# Patient Record
Sex: Male | Born: 2000 | Race: Black or African American | Hispanic: No | Marital: Single | Smoking: Never smoker
Health system: Southern US, Community
[De-identification: ages and names within clinical notes are randomized; demographics above are authoritative.]

## PROBLEM LIST (undated history)

## (undated) DIAGNOSIS — F70 Mild intellectual disabilities: Secondary | ICD-10-CM

## (undated) DIAGNOSIS — F319 Bipolar disorder, unspecified: Secondary | ICD-10-CM

## (undated) DIAGNOSIS — F913 Oppositional defiant disorder: Secondary | ICD-10-CM

## (undated) DIAGNOSIS — F909 Attention-deficit hyperactivity disorder, unspecified type: Secondary | ICD-10-CM

---
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
## 2020-05-30 ENCOUNTER — Other Ambulatory Visit: Payer: Self-pay

## 2020-05-30 ENCOUNTER — Ambulatory Visit: Payer: Medicaid Other | Attending: Internal Medicine

## 2020-05-30 DIAGNOSIS — Z23 Encounter for immunization: Secondary | ICD-10-CM

## 2020-05-30 NOTE — Progress Notes (Signed)
° °  Covid-19 Vaccination Clinic  Name:  Edward [name]    MRN: [id number] DOB: September 11, 2000  05/30/2020  Edward [name] was observed post Covid-19 immunization for 15 minutes without incident. He was provided with Vaccine Information Sheet and instruction to access the V-Safe system.   Edward [name] was instructed to call 911 with any severe reactions post vaccine:  Difficulty breathing   Swelling of face and throat   A fast heartbeat   A bad rash all over body   Dizziness and weakness   Immunizations Administered    Name Date Dose VIS Date Route   Pfizer COVID-19 Vaccine 05/30/2020  1:23 PM 0.3 mL 10/27/2018 Intramuscular   Manufacturer: [employer]   Lot: [id number]   NDC: [id number]

## 2020-11-29 ENCOUNTER — Emergency Department (HOSPITAL_COMMUNITY)
Admission: EM | Admit: 2020-11-29 | Discharge: 2020-11-29 | Disposition: A | Discharge status: Home / Self Care | Payer: Medicaid Other | Attending: Emergency Medicine | Admitting: Emergency Medicine

## 2020-11-29 DIAGNOSIS — Z5321 Procedure and treatment not carried out due to patient leaving prior to being seen by health care provider: Secondary | ICD-10-CM | POA: Diagnosis not present

## 2020-11-29 DIAGNOSIS — Y92410 Unspecified street and highway as the place of occurrence of the external cause: Secondary | ICD-10-CM | POA: Insufficient documentation

## 2020-11-29 DIAGNOSIS — S8991XA Unspecified injury of right lower leg, initial encounter: Secondary | ICD-10-CM | POA: Diagnosis present

## 2020-11-29 DIAGNOSIS — S81011A Laceration without foreign body, right knee, initial encounter: Secondary | ICD-10-CM | POA: Insufficient documentation

## 2020-11-29 DIAGNOSIS — Y9355 Activity, bike riding: Secondary | ICD-10-CM | POA: Diagnosis not present

## 2020-11-29 LAB — CBC WITH DIFFERENTIAL/PLATELET
Abs Immature Granulocytes: 0 10*3/uL (ref 0.00–0.07)
Basophils Absolute: 0 10*3/uL (ref 0.0–0.1)
Basophils Relative: 1 %
Eosinophils Absolute: 0.3 10*3/uL (ref 0.0–0.5)
Eosinophils Relative: 6 %
HCT: 41 % (ref 39.0–52.0)
Hemoglobin: 13.9 g/dL (ref 13.0–17.0)
Immature Granulocytes: 0 %
Lymphocytes Relative: 34 %
Lymphs Abs: 1.6 10*3/uL (ref 0.7–4.0)
MCH: 29 pg (ref 26.0–34.0)
MCHC: 33.9 g/dL (ref 30.0–36.0)
MCV: 85.6 fL (ref 80.0–100.0)
Monocytes Absolute: 0.7 10*3/uL (ref 0.1–1.0)
Monocytes Relative: 15 %
Neutro Abs: 2.1 10*3/uL (ref 1.7–7.7)
Neutrophils Relative %: 44 %
Platelets: 233 10*3/uL (ref 150–400)
RBC: 4.79 MIL/uL (ref 4.22–5.81)
RDW: 14.9 % (ref 11.5–15.5)
WBC: 4.7 10*3/uL (ref 4.0–10.5)
nRBC: 0 % (ref 0.0–0.2)

## 2020-11-29 LAB — COMPREHENSIVE METABOLIC PANEL
ALT: 15 U/L (ref 0–44)
AST: 26 U/L (ref 15–41)
Albumin: 3.9 g/dL (ref 3.5–5.0)
Alkaline Phosphatase: 42 U/L (ref 38–126)
Anion gap: 3 — ABNORMAL LOW (ref 5–15)
BUN: 9 mg/dL (ref 6–20)
CO2: 30 mmol/L (ref 22–32)
Calcium: 9.2 mg/dL (ref 8.9–10.3)
Chloride: 104 mmol/L (ref 98–111)
Creatinine, Ser: 0.92 mg/dL (ref 0.61–1.24)
GFR, Estimated: >60 mL/min (ref >60)
Glucose, Bld: 100 mg/dL — ABNORMAL HIGH (ref 70–99)
Potassium: 3.8 mmol/L (ref 3.5–5.1)
Sodium: 137 mmol/L (ref 135–145)
Total Bilirubin: 0.9 mg/dL (ref 0.3–1.2)
Total Protein: 7.5 g/dL (ref 6.5–8.1)

## 2020-11-29 NOTE — ED Notes (Signed)
Pt stated "I am going home", LWBS, moving OTF.
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
## 2022-07-11 ENCOUNTER — Ambulatory Visit (HOSPITAL_COMMUNITY)
Admission: EM | Admit: 2022-07-11 | Discharge: 2022-07-11 | Disposition: A | Discharge status: Home / Self Care | Payer: Medicaid Other | Attending: Psychiatry | Admitting: Psychiatry

## 2022-07-11 DIAGNOSIS — Z1152 Encounter for screening for COVID-19: Secondary | ICD-10-CM | POA: Insufficient documentation

## 2022-07-11 DIAGNOSIS — Z5901 Sheltered homelessness: Secondary | ICD-10-CM | POA: Insufficient documentation

## 2022-07-11 DIAGNOSIS — R45851 Suicidal ideations: Secondary | ICD-10-CM | POA: Insufficient documentation

## 2022-07-11 DIAGNOSIS — R4689 Other symptoms and signs involving appearance and behavior: Secondary | ICD-10-CM

## 2022-07-11 DIAGNOSIS — F418 Other specified anxiety disorders: Secondary | ICD-10-CM

## 2022-07-11 DIAGNOSIS — F319 Bipolar disorder, unspecified: Secondary | ICD-10-CM | POA: Insufficient documentation

## 2022-07-11 DIAGNOSIS — F3481 Disruptive mood dysregulation disorder: Secondary | ICD-10-CM | POA: Insufficient documentation

## 2022-07-11 DIAGNOSIS — Z79899 Other long term (current) drug therapy: Secondary | ICD-10-CM | POA: Insufficient documentation

## 2022-07-11 DIAGNOSIS — F69 Unspecified disorder of adult personality and behavior: Secondary | ICD-10-CM

## 2022-07-11 DIAGNOSIS — R45850 Homicidal ideations: Secondary | ICD-10-CM | POA: Insufficient documentation

## 2022-07-11 LAB — COMPREHENSIVE METABOLIC PANEL
ALT: 15 U/L (ref 0–44)
AST: 26 U/L (ref 15–41)
Albumin: 4.3 g/dL (ref 3.5–5.0)
Alkaline Phosphatase: 43 U/L (ref 38–126)
Anion gap: 12 (ref 5–15)
BUN: 9 mg/dL (ref 6–20)
CO2: 26 mmol/L (ref 22–32)
Calcium: 9.9 mg/dL (ref 8.9–10.3)
Chloride: 98 mmol/L (ref 98–111)
Creatinine, Ser: 0.86 mg/dL (ref 0.61–1.24)
GFR, Estimated: >60 mL/min (ref >60)
Glucose, Bld: 124 mg/dL — ABNORMAL HIGH (ref 70–99)
Potassium: 3.3 mmol/L — ABNORMAL LOW (ref 3.5–5.1)
Sodium: 136 mmol/L (ref 135–145)
Total Bilirubin: 2 mg/dL — ABNORMAL HIGH (ref 0.3–1.2)
Total Protein: 7.8 g/dL (ref 6.5–8.1)

## 2022-07-11 LAB — RESP PANEL BY RT-PCR (FLU A&B, COVID) ARPGX2
Influenza A by PCR: NEGATIVE
Influenza B by PCR: NEGATIVE
SARS Coronavirus 2 by RT PCR: NEGATIVE

## 2022-07-11 LAB — POCT URINE DRUG SCREEN - MANUAL ENTRY (I-SCREEN)
POC Amphetamine UR: NOT DETECTED
POC Buprenorphine (BUP): NOT DETECTED
POC Cocaine UR: NOT DETECTED
POC Marijuana UR: NOT DETECTED
POC Methadone UR: NOT DETECTED
POC Methamphetamine UR: NOT DETECTED
POC Morphine: NOT DETECTED
POC Oxazepam (BZO): NOT DETECTED
POC Oxycodone UR: NOT DETECTED
POC Secobarbital (BAR): NOT DETECTED

## 2022-07-11 LAB — CBC WITH DIFFERENTIAL/PLATELET
Abs Immature Granulocytes: 0.01 10*3/uL (ref 0.00–0.07)
Basophils Absolute: 0 10*3/uL (ref 0.0–0.1)
Basophils Relative: 1 %
Eosinophils Absolute: 0.2 10*3/uL (ref 0.0–0.5)
Eosinophils Relative: 3 %
HCT: 44.4 % (ref 39.0–52.0)
Hemoglobin: 15.5 g/dL (ref 13.0–17.0)
Immature Granulocytes: 0 %
Lymphocytes Relative: 38 %
Lymphs Abs: 2.4 10*3/uL (ref 0.7–4.0)
MCH: 28.8 pg (ref 26.0–34.0)
MCHC: 34.9 g/dL (ref 30.0–36.0)
MCV: 82.5 fL (ref 80.0–100.0)
Monocytes Absolute: 0.6 10*3/uL (ref 0.1–1.0)
Monocytes Relative: 10 %
Neutro Abs: 3 10*3/uL (ref 1.7–7.7)
Neutrophils Relative %: 48 %
Platelets: 228 10*3/uL (ref 150–400)
RBC: 5.38 MIL/uL (ref 4.22–5.81)
RDW: 13.9 % (ref 11.5–15.5)
WBC: 6.2 10*3/uL (ref 4.0–10.5)
nRBC: 0 % (ref 0.0–0.2)

## 2022-07-11 LAB — HEMOGLOBIN A1C
Hgb A1c MFr Bld: 5.3 % (ref 4.8–5.6)
Mean Plasma Glucose: 105.41 mg/dL

## 2022-07-11 LAB — LIPID PANEL
Cholesterol: 136 mg/dL (ref 0–200)
HDL: 51 mg/dL (ref >40)
LDL Cholesterol: 77 mg/dL (ref 0–99)
Total CHOL/HDL Ratio: 2.7 RATIO
Triglycerides: 39 mg/dL (ref <150)
VLDL: 8 mg/dL (ref 0–40)

## 2022-07-11 LAB — POC SARS CORONAVIRUS 2 AG: SARSCOV2ONAVIRUS 2 AG: NEGATIVE

## 2022-07-11 MED ORDER — MAGNESIUM HYDROXIDE 400 MG/5ML PO SUSP: 30.0000 mL | Freq: Every day | ORAL | Status: DC | PRN

## 2022-07-11 MED ORDER — ARIPIPRAZOLE 5 MG PO TABS: 5.0000 mg | ORAL_TABLET | Freq: Every day | ORAL | 0 refills | Status: DC

## 2022-07-11 MED ORDER — ACETAMINOPHEN 325 MG PO TABS: 650.0000 mg | ORAL_TABLET | Freq: Four times a day (QID) | ORAL | Status: DC | PRN

## 2022-07-11 MED ORDER — ARIPIPRAZOLE 5 MG PO TABS
5.0000 mg | ORAL_TABLET | Freq: Every day | ORAL | Status: DC
  Administered 2022-07-11: 5 mg via ORAL
  Filled 2022-07-11: qty 1

## 2022-07-11 MED ORDER — CARBAMAZEPINE 200 MG PO TABS
200.0000 mg | ORAL_TABLET | Freq: Two times a day (BID) | ORAL | Status: DC
  Administered 2022-07-11: 200 mg via ORAL
  Filled 2022-07-11: qty 1

## 2022-07-11 MED ORDER — ALUM & MAG HYDROXIDE-SIMETH 200-200-20 MG/5ML PO SUSP: 30.0000 mL | ORAL | Status: DC | PRN

## 2022-07-11 NOTE — ED Notes (Signed)
Phone DASH pickup called and notified of pending collected STAT Labs to be transported to [facility] 

## 2022-07-11 NOTE — ED Notes (Signed)
Discharge instructions provided and Pt stated understanding. Pt alert, orient and ambulatory prior to d/c from facility. Personal belongings returned from locker number 21. Pt allowed to change clothes in locker room. Pt escorted to lobby to D/C from facility. Safety maintained.

## 2022-07-11 NOTE — ED Notes (Signed)
Patient to unit - denies SI, HI AVH at this time - will continue to monitor for safety

## 2022-07-11 NOTE — Discharge Instructions (Addendum)
Discharge recommendations:  Patient is to take medications as prescribed. Abilify 5 mg po daily was added on 07/10/22. Take Abilify 5 mg by mouth daily x 14 days and follow up with your outpatient psychiatrist for medication management.   Please see information for follow-up appointment with psychiatry and therapy.  Please follow up with your primary care provider for all medical related needs.   Therapy: We recommend that patient participate in individual therapy to address mental health concerns.  Medications: The parent/guardian is to contact a medical professional and/or outpatient provider to address any new side effects that develop. Parent/guardian should update outpatient providers of any new medications and/or medication changes.   Atypical antipsychotics: If you are prescribed an atypical antipsychotic, it is recommended that your height, weight, BMI, blood pressure, fasting lipid panel, and fasting blood sugar be monitored by your outpatient providers.  Safety:  The patient should abstain from use of illicit substances/drugs and abuse of any medications. If symptoms worsen or do not continue to improve or if the patient becomes actively suicidal or homicidal then it is recommended that the patient return to the closest hospital emergency department, the [facility], or call 911 for further evaluation and treatment. National Suicide Prevention Lifeline 1-800-SUICIDE or 601-595-5688.  About 988 988 offers 24/7 access to trained crisis counselors who can help people experiencing mental health-related distress. People can call or text 988 or chat 988lifeline.org for themselves or if they are worried about a loved one who may need crisis support.

## 2022-07-11 NOTE — ED Provider Notes (Signed)
FBC/OBS ASAP Discharge Summary  Date and Time: 07/11/2022 9:56 AM  Name: Edward [name]  MRN:  [id number]   Discharge Diagnoses:  Final diagnoses:  Suicidal ideation  Aggression  Anxious depression  Behavior concern in adult    Subjective: Patient seen and reevaluated face-to-face by this provider, chart reviewed and case discussed with Dr. Lucianne Muss. On evaluation, patient is alert and oriented x 4. His thought process is linear and speech is clear and coherent at a moderate tone. His mood is euthymic and affect is congruent. He has fair eye contact. Today, he denies suicidal ideations. He verbally contracts for safety. He denies past suicide attempts. He denies homicidal ideations. He denies auditory or visual hallucinations. There is no objective evidence that the patient is currently responding to internal or external stimuli. He reports fair sleep. He reports a fair appetite. He states that last night he was upset because of a situation with his peers. He states that he has been getting bullied online, on social media by his peers. He states that his peers then accused him of harassment which upset him. He states that he receives outpatient therapy with [facility] and that his next appointment is on next Wednesday. He states that he is prescribed fluoxetine. He states that last night he was given 2 new medications. He denies medication side effects to those medications. He lives alone in transitional housing. He denies access to weapon, including guns. He identifies Ms. [name] and the staff at [facility] as his support system. He identifies healthy coping skills as playing his game which makes him feel neutral, and listening to rock music. We discussed additional healthy coping mechanisms such as journaling, taking a walk, deep breathing, and reaching out to his support system.  He verbalizes understanding and agrees to the stated plan.  I reached out to [employer] at [facility] this  morning who was not available. I spoke to Lezlie Lye at [facility] who states that [name] is unavailable. She states that she can send [name] to pick the patient up from the facility. She states that the patient is his own guardian and lives alone. I safety planned with Lezlie Lye. I advised that all weapons in the home including guns and sharp knives be removed and locked up for safety. I advised that if the patient's symptoms worsen or he verbalizes active suicidal thoughts to bring the patient to the [facility], nearest ED or call 911 for an evaluation. MS. LaBonne verbalizes understanding and denies any safety concerns with the patient discharging home today.  Stay Summary: Triage notes copied: Pt presents to [facility] voluntarily, accompanied by his transitional home program manager due to feeling paranoid and suicidal ideation with no plan. Pt was unable to give specific information pertaining to what triggered his stress. Per home manager, pt was involved in online bullying and pt sent a text message tonight stating that he needed help and was having suicidal thoughts. Pt stated that "I mean I think about it and want to die, but I'm too scared to do it". Pt has hx aggressive behaviors, DMDD, HI. Pt is prescribed Fluoxetine 20mg  and also receives outpatient therapy through [facility]. Pt reports that he has taken medication since yesterday. Pt currently denies SI, HI, AVH and substance/alcohol use.   Patient was started on Abilify 5 mg p.o. daily and Tegretol 200 mg twice daily on 07/10/22 by 13/8/23, NP. Tegretol was discontinued. Patient was advised to continue on Prozac 20  mg p.o. daily as prescribed by his outpatient provider.  And Abilify 5 mg p.o. daily for mood stabilization.    Patient was observed on the unit without any disruptive, aggressive, psychotic, or self-harm behaviors.  Total Time spent with patient: 30 minutes  Past Psychiatric History: History of  oppositional defiant disorder, disruptive mood dysregulation disorder, and aggressive behaviors.  Past Medical History:  Past Medical History:  Diagnosis Date   ADHD    Bipolar 1 disorder (HCC)    ODD (oppositional defiant disorder)    No past surgical history on file. Family History: No family history on file. Family Psychiatric History: No known history.  Social History:  Social History   Substance and Sexual Activity  Alcohol Use No     Social History   Substance and Sexual Activity  Drug Use No    Social History   Socioeconomic History   Marital status: Single    Spouse name: Not on file   Number of children: Not on file   Years of education: Not on file   Highest education level: Not on file  Occupational History   Not on file  Tobacco Use   Smoking status: Never   Smokeless tobacco: Never  Substance and Sexual Activity   Alcohol use: No   Drug use: No   Sexual activity: Not on file  Other Topics Concern   Not on file  Social History Narrative   Not on file   Social Determinants of Health   Financial Resource Strain: Not on file  Food Insecurity: Not on file  Transportation Needs: Not on file  Physical Activity: Not on file  Stress: Not on file  Social Connections: Not on file   SDOH:  SDOH Screenings   Tobacco Use: Low Risk  (09/15/2020)    Tobacco Cessation:  N/A, patient does not currently use tobacco products  Current Medications:  Current Facility-Administered Medications  Medication Dose Route Frequency Provider Last Rate Last Admin   acetaminophen (TYLENOL) tablet 650 mg  650 mg Oral Q6H PRN Sindy Guadeloupe, NP       alum & mag hydroxide-simeth (MAALOX/MYLANTA) 200-200-20 MG/5ML suspension 30 mL  30 mL Oral Q4H PRN Sindy Guadeloupe, NP       ARIPiprazole (ABILIFY) tablet 5 mg  5 mg Oral Daily Sindy Guadeloupe, NP   5 mg at 07/11/22 2130   magnesium hydroxide (MILK OF MAGNESIA) suspension 30 mL  30 mL Oral Daily PRN Sindy Guadeloupe, NP        Current Outpatient Medications  Medication Sig Dispense Refill   FLUoxetine (PROZAC) 20 MG capsule Take 20 mg by mouth every morning.     [START ON 07/12/2022] ARIPiprazole (ABILIFY) 5 MG tablet Take 1 tablet (5 mg total) by mouth daily. 14 tablet 0     Musculoskeletal  Strength & Muscle Tone: within normal limits Gait & Station: normal Patient leans: N/A  Psychiatric Specialty Exam  Presentation  General Appearance:  Appropriate for Environment  Eye Contact: Fair  Speech: Clear and Coherent  Speech Volume: Normal  Handedness: Right   Mood and Affect  Mood: Dysphoric  Affect: Congruent   Thought Process  Thought Processes: Coherent  Descriptions of Associations:Intact  Orientation:Full (Time, Place and Person)  Thought Content:Logical  Diagnosis of Schizophrenia or Schizoaffective disorder in past: No    Hallucinations:Hallucinations: None  Ideas of Reference:None  Suicidal Thoughts:Suicidal Thoughts: No SI Passive Intent and/or Plan: With Plan  Homicidal Thoughts:Homicidal Thoughts: No   Sensorium  Memory: Immediate  Fair; Recent Fair; Remote Fair  Judgment: Fair  Insight: Fair   Chartered certified accountant: Fair  Attention Span: Fair  Recall: [employer] of Knowledge: Fair  Language: Fair   Psychomotor Activity  Psychomotor Activity: Psychomotor Activity: Normal   Assets  Assets: Communication Skills; Desire for Improvement; Financial Resources/Insurance; Housing; Leisure Time; Physical Health; Social Support   Sleep  Sleep: Sleep: Fair Number of Hours of Sleep: 6   Nutritional Assessment (For OBS and FBC admissions only) Has the patient had a weight loss or gain of 10 pounds or more in the last 3 months?: No Has the patient had a decrease in food intake/or appetite?: No Does the patient have dental problems?: No Does the patient have eating habits or behaviors that may be indicators of an eating  disorder including binging or inducing vomiting?: No Has the patient recently lost weight without trying?: 0 Has the patient been eating poorly because of a decreased appetite?: 0 Malnutrition Screening Tool Score: 0    Physical Exam  Physical Exam HENT:     Head: Normocephalic.     Nose: Nose normal.  Eyes:     Conjunctiva/sclera: Conjunctivae normal.  Cardiovascular:     Rate and Rhythm: Normal rate.  Pulmonary:     Effort: Pulmonary effort is normal.  Musculoskeletal:        General: Normal range of motion.  Neurological:     Mental Status: He is alert and oriented to person, place, and time.    Review of Systems  Constitutional: Negative.   HENT: Negative.    Eyes: Negative.   Respiratory: Negative.    Cardiovascular: Negative.   Gastrointestinal: Negative.   Genitourinary: Negative.   Musculoskeletal: Negative.   Neurological: Negative.   Endo/Heme/Allergies: Negative.    Blood pressure 107/75, pulse (!) 56, temperature 98.1 F (36.7 C), temperature source Oral, resp. rate 18, SpO2 100 %. There is no height or weight on file to calculate BMI.  Demographic Factors:  Male, Adolescent or young adult, and Living alone  Loss Factors: NA  Historical Factors: Impulsivity  Risk Reduction Factors:   Religious beliefs about death and Positive social support  Continued Clinical Symptoms:  Previous Psychiatric Diagnoses and Treatments  Cognitive Features That Contribute To Risk:  None    Suicide Risk:  Minimal: No identifiable suicidal ideation.  Patients presenting with no risk factors but with morbid ruminations; may be classified as minimal risk based on the severity of the depressive symptoms  Plan Of Care/Follow-up recommendations:  Activity:  as tolerated.   Discharge recommendations:  Patient is to take medications as prescribed. Abilify 5 mg po daily was added on 07/10/22. Take Abilify 5 mg by mouth daily x 14 days and follow up with your outpatient  psychiatrist for medication management.   Please see information for follow-up appointment with psychiatry and therapy.  Please follow up with your primary care provider for all medical related needs.   Therapy: We recommend that patient participate in individual therapy to address mental health concerns.  Medications: The parent/guardian is to contact a medical professional and/or outpatient provider to address any new side effects that develop. Parent/guardian should update outpatient providers of any new medications and/or medication changes.   Atypical antipsychotics: If you are prescribed an atypical antipsychotic, it is recommended that your height, weight, BMI, blood pressure, fasting lipid panel, and fasting blood sugar be monitored by your outpatient providers.  Safety:  The patient should abstain from use of illicit substances/drugs  and abuse of any medications. If symptoms worsen or do not continue to improve or if the patient becomes actively suicidal or homicidal then it is recommended that the patient return to the closest hospital emergency department, the [facility], or call 911 for further evaluation and treatment. National Suicide Prevention Lifeline 1-800-SUICIDE or 205-875-4236.  About 988 988 offers 24/7 access to trained crisis counselors who can help people experiencing mental health-related distress. People can call or text 988 or chat 988lifeline.org for themselves or if they are worried about a loved one who may need crisis support.    Follow-up Information     [facility].   Specialty: Psychology Contact information: [address] [place] [address] [phone]         [facility].   Specialty: Professional Counselor Why: intensive in home if needed but also outpatient therapy Contact information: [address] [place]  [address] [phone]                 Disposition: Discharge home.   Layla Barter, NP 07/11/2022, 9:56 AM

## 2022-07-11 NOTE — ED Triage Notes (Signed)
Pt presents to [facility] voluntarily, accompanied by his transitional home program manager due to feeling paranoid and suicidal ideation with no plan. Pt was unable to give specific information pertaining to what triggered his stress. Per home manager, pt was involved in online bullying and pt sent a text message tonight stating that he needed help and was having suicidal thoughts. Pt stated that "I mean I think about it and want to die, but I'm too scared to do it". Pt has hx aggressive behaviors, DMDD, HI. Pt is prescribed Fluoxetine 20mg  and also receives outpatient therapy through [facility]. Pt reports that he has taken medication since yesterday. Pt currently denies SI, HI, AVH and substance/alcohol use.

## 2022-07-11 NOTE — ED Notes (Signed)
Patient resting at this time with no sxs of distress noted - will continue to monitor for safety  

## 2022-07-11 NOTE — ED Provider Notes (Signed)
[facility] Continuous Assessment Admission H&P  Date: 07/11/22 Patient Name: Edward [name] MRN: [id number] Chief Complaint:  Chief Complaint  Patient presents with   Suicidal      Diagnoses:  Final diagnoses:  Suicidal ideation  Aggression  Anxious depression  Behavior concern in adult    HPI: Edward [name] "Edward [name]",  21 y.o male, with a history of DMDD, aggressive behavior, homicidal ideation presented to [facility] accompanied by his transitional home Dietitian.  According to patient he is not feeling good he is emotionally stressed because he was involved in a online bullying because he had sent a text tonight stating he needs help and he was having suicidal thoughts.  Patient is not a good historian and patient is not forthcoming with his story.   Triage notes copied: Pt presents to [facility] voluntarily, accompanied by his transitional home program manager due to feeling paranoid and suicidal ideation with no plan. Pt was unable to give specific information pertaining to what triggered his stress. Per home manager, pt was involved in online bullying and pt sent a text message tonight stating that he needed help and was having suicidal thoughts. Pt stated that "I mean I think about it and want to die, but I'm too scared to do it". Pt has hx aggressive behaviors, DMDD, HI. Pt is prescribed Fluoxetine 20mg  and also receives outpatient therapy through [facility]. Pt reports that he has taken medication since yesterday. Pt currently denies SI, HI, AVH and substance/alcohol use.    Collateral, her transitional manager patient is not safe going home tonight because patient will be alone by himself however staff will be present tomorrow start around 10 AM.   Face-to-face observation of patient, patient is alert and oriented, speech is clear however patient refused to answer questions appropriately.  Patient keeps holding his head down and refused to make eye  contact.  Patient endorsed suicidal ideations but have no immediate plans denies HI, AVH, denies alcohol use denies drug use.   Recommend inpatient observation   PHQ 2-9:   Flowsheet Row ED from 07/11/2022 in [facility] ED from 11/29/2020 in [facility] EMERGENCY DEPARTMENT ED from 11/07/2019 in [facility] EMERGENCY DEPARTMENT  C-SSRS RISK CATEGORY High Risk No Risk Error: Q6 is Yes, you must answer 7        Total Time spent with patient: 20 minutes  Musculoskeletal  Strength & Muscle Tone: within normal limits Gait & Station: normal Patient leans: N/A  Psychiatric Specialty Exam  Presentation General Appearance:  Casual  Eye Contact: Minimal  Speech: Blocked  Speech Volume: Normal  Handedness: Right   Mood and Affect  Mood: Depressed  Affect: Constricted   Thought Process  Thought Processes: Linear  Descriptions of Associations:Intact  Orientation:Full (Time, Place and Person)  Thought Content:WDL    Hallucinations:Hallucinations: None  Ideas of Reference:None  Suicidal Thoughts:Suicidal Thoughts: Yes, Passive SI Passive Intent and/or Plan: With Plan  Homicidal Thoughts:Homicidal Thoughts: No   Sensorium  Memory: Immediate Fair  Judgment: Poor  Insight: Fair   [facility]: Fair  Attention Span: Fair  Recall: Chartered certified accountant of Knowledge: Fair  Language: Fair   Psychomotor Activity  Psychomotor Activity: Psychomotor Activity: Normal   Assets  Assets: Desire for Improvement; Resilience; Social Support   Sleep  Sleep: Sleep: Fair Number of Hours of Sleep: 6   Nutritional Assessment (For OBS and FBC admissions only) Has the patient had  a weight loss or gain of 10 pounds or more in the last 3 months?: No Has the patient had a decrease in food intake/or appetite?: No Does the patient have dental problems?: No Does the patient have  eating habits or behaviors that may be indicators of an eating disorder including binging or inducing vomiting?: No Has the patient recently lost weight without trying?: 0 Has the patient been eating poorly because of a decreased appetite?: 0 Malnutrition Screening Tool Score: 0    Physical Exam HENT:     Head: Normocephalic.     Nose: Nose normal.  Cardiovascular:     Rate and Rhythm: Normal rate.  Pulmonary:     Effort: Pulmonary effort is normal.  Musculoskeletal:        General: Normal range of motion.     Cervical back: Normal range of motion.  Skin:    General: Skin is warm.  Neurological:     General: No focal deficit present.     Mental Status: He is alert.  Psychiatric:        Mood and Affect: Mood normal.        Behavior: Behavior normal.        Thought Content: Thought content normal.        Judgment: Judgment normal.    Review of Systems  Constitutional: Negative.   HENT: Negative.    Eyes: Negative.   Respiratory: Negative.    Cardiovascular: Negative.   Gastrointestinal: Negative.   Genitourinary: Negative.   Musculoskeletal: Negative.   Skin: Negative.   Neurological: Negative.   Endo/Heme/Allergies: Negative.   Psychiatric/Behavioral:  Positive for depression and suicidal ideas. The patient is nervous/anxious.     Blood pressure 125/81, pulse (!) 59, temperature 98.9 F (37.2 C), temperature source Oral, resp. rate 16, SpO2 100 %. There is no height or weight on file to calculate BMI.  Past Psychiatric History: DMDD, aggression    Is the patient at risk to self? Yes  Has the patient been a risk to self in the past 6 months? Yes .    Has the patient been a risk to self within the distant past? Yes   Is the patient a risk to others? Yes   Has the patient been a risk to others in the past 6 months? No   Has the patient been a risk to others within the distant past? No   Past Medical History:  Past Medical History:  Diagnosis Date   ADHD     Bipolar 1 disorder (HCC)    ODD (oppositional defiant disorder)    No past surgical history on file.  Family History: No family history on file.  Social History:  Social History   Socioeconomic History   Marital status: Single    Spouse name: Not on file   Number of children: Not on file   Years of education: Not on file   Highest education level: Not on file  Occupational History   Not on file  Tobacco Use   Smoking status: Never   Smokeless tobacco: Never  Substance and Sexual Activity   Alcohol use: No   Drug use: No   Sexual activity: Not on file  Other Topics Concern   Not on file  Social History Narrative   Not on file   Social Determinants of Health   Financial Resource Strain: Not on file  Food Insecurity: Not on file  Transportation Needs: Not on file  Physical Activity: Not on file  Stress: Not on file  Social Connections: Not on file  Intimate Partner Violence: Not on file    SDOH:  SDOH Screenings   Tobacco Use: Low Risk  (09/15/2020)    Last Labs:  Admission on 07/11/2022  Component Date Value Ref Range Status   SARS Coronavirus 2 by RT PCR 07/11/2022 NEGATIVE  NEGATIVE Final   Comment: (NOTE) SARS-CoV-2 target nucleic acids are NOT DETECTED.  The SARS-CoV-2 RNA is generally detectable in upper respiratory specimens during the acute phase of infection. The lowest concentration of SARS-CoV-2 viral copies this assay can detect is 138 copies/mL. A negative result does not preclude SARS-Cov-2 infection and should not be used as the sole basis for treatment or other patient management decisions. A negative result may occur with  improper specimen collection/handling, submission of specimen other than nasopharyngeal swab, presence of viral mutation(s) within the areas targeted by this assay, and inadequate number of viral copies(<138 copies/mL). A negative result must be combined with clinical observations, patient history, and  epidemiological information. The expected result is Negative.  Fact Sheet for Patients:  BloggerCourse.com  Fact Sheet for Healthcare Providers:  SeriousBroker.it  This test is no                          t yet approved or cleared by the [place] FDA and  has been authorized for detection and/or diagnosis of SARS-CoV-2 by FDA under an Emergency Use Authorization (EUA). This EUA will remain  in effect (meaning this test can be used) for the duration of the COVID-19 declaration under Section 564(b)(1) of the Act, 21 U.S.C.section 360bbb-3(b)(1), unless the authorization is terminated  or revoked sooner.       Influenza A by PCR 07/11/2022 NEGATIVE  NEGATIVE Final   Influenza B by PCR 07/11/2022 NEGATIVE  NEGATIVE Final   Comment: (NOTE) The Xpert Xpress SARS-CoV-2/FLU/RSV plus assay is intended as an aid in the diagnosis of influenza from Nasopharyngeal swab specimens and should not be used as a sole basis for treatment. Nasal washings and aspirates are unacceptable for Xpert Xpress SARS-CoV-2/FLU/RSV testing.  Fact Sheet for Patients: BloggerCourse.com  Fact Sheet for Healthcare Providers: SeriousBroker.it  This test is not yet approved or cleared by the [place] FDA and has been authorized for detection and/or diagnosis of SARS-CoV-2 by FDA under an Emergency Use Authorization (EUA). This EUA will remain in effect (meaning this test can be used) for the duration of the COVID-19 declaration under Section 564(b)(1) of the Act, 21 U.S.C. section 360bbb-3(b)(1), unless the authorization is terminated or revoked.  Performed at [facility], [address] [place], [place] [address]    WBC 07/11/2022 6.2  4.0 - 10.5 K/uL Final   RBC 07/11/2022 5.38  4.22 - 5.81 MIL/uL Final   Hemoglobin 07/11/2022 15.5  13.0 - 17.0 g/dL Final   HCT 62/70/3500 44.4  39.0 - 52.0  % Final   MCV 07/11/2022 82.5  80.0 - 100.0 fL Final   MCH 07/11/2022 28.8  26.0 - 34.0 pg Final   MCHC 07/11/2022 34.9  30.0 - 36.0 g/dL Final   RDW 93/81/8299 13.9  11.5 - 15.5 % Final   Platelets 07/11/2022 228  150 - 400 K/uL Final   nRBC 07/11/2022 0.0  0.0 - 0.2 % Final   Neutrophils Relative % 07/11/2022 48  % Final   Neutro Abs 07/11/2022 3.0  1.7 - 7.7 K/uL Final   Lymphocytes Relative 07/11/2022  38  % Final   Lymphs Abs 07/11/2022 2.4  0.7 - 4.0 K/uL Final   Monocytes Relative 07/11/2022 10  % Final   Monocytes Absolute 07/11/2022 0.6  0.1 - 1.0 K/uL Final   Eosinophils Relative 07/11/2022 3  % Final   Eosinophils Absolute 07/11/2022 0.2  0.0 - 0.5 K/uL Final   Basophils Relative 07/11/2022 1  % Final   Basophils Absolute 07/11/2022 0.0  0.0 - 0.1 K/uL Final   Immature Granulocytes 07/11/2022 0  % Final   Abs Immature Granulocytes 07/11/2022 0.01  0.00 - 0.07 K/uL Final   Performed at [facility], [address] [place], [place] [address]   Sodium 07/11/2022 136  135 - 145 mmol/L Final   Potassium 07/11/2022 3.3 (L)  3.5 - 5.1 mmol/L Final   Chloride 07/11/2022 98  98 - 111 mmol/L Final   CO2 07/11/2022 26  22 - 32 mmol/L Final   Glucose, Bld 07/11/2022 124 (H)  70 - 99 mg/dL Final   Glucose reference range applies only to samples taken after fasting for at least 8 hours.   BUN 07/11/2022 9  6 - 20 mg/dL Final   Creatinine, Ser 07/11/2022 0.86  0.61 - 1.24 mg/dL Final   Calcium 57/84/6962 9.9  8.9 - 10.3 mg/dL Final   Total Protein 95/28/4132 7.8  6.5 - 8.1 g/dL Final   Albumin 44/09/270 4.3  3.5 - 5.0 g/dL Final   AST 53/66/4403 26  15 - 41 U/L Final   ALT 07/11/2022 15  0 - 44 U/L Final   Alkaline Phosphatase 07/11/2022 43  38 - 126 U/L Final   Total Bilirubin 07/11/2022 2.0 (H)  0.3 - 1.2 mg/dL Final   GFR, Estimated 07/11/2022 >60  >60 mL/min Final   Comment: (NOTE) Calculated using the CKD-EPI Creatinine Equation (2021)    Anion gap 07/11/2022 12  5 - 15  Final   Performed at [facility], [address] [place], [place] [address]   POC Amphetamine UR 07/11/2022 None Detected  NONE DETECTED (Cut Off Level 1000 ng/mL) Final   POC Secobarbital (BAR) 07/11/2022 None Detected  NONE DETECTED (Cut Off Level 300 ng/mL) Final   POC Buprenorphine (BUP) 07/11/2022 None Detected  NONE DETECTED (Cut Off Level 10 ng/mL) Final   POC Oxazepam (BZO) 07/11/2022 None Detected  NONE DETECTED (Cut Off Level 300 ng/mL) Final   POC Cocaine UR 07/11/2022 None Detected  NONE DETECTED (Cut Off Level 300 ng/mL) Final   POC Methamphetamine UR 07/11/2022 None Detected  NONE DETECTED (Cut Off Level 1000 ng/mL) Final   POC Morphine 07/11/2022 None Detected  NONE DETECTED (Cut Off Level 300 ng/mL) Final   POC Methadone UR 07/11/2022 None Detected  NONE DETECTED (Cut Off Level 300 ng/mL) Final   POC Oxycodone UR 07/11/2022 None Detected  NONE DETECTED (Cut Off Level 100 ng/mL) Final   POC Marijuana UR 07/11/2022 None Detected  NONE DETECTED (Cut Off Level 50 ng/mL) Final   SARSCOV2ONAVIRUS 2 AG 07/11/2022 NEGATIVE  NEGATIVE Final   Comment: (NOTE) SARS-CoV-2 antigen NOT DETECTED.   Negative results are presumptive.  Negative results do not preclude SARS-CoV-2 infection and should not be used as the sole basis for treatment or other patient management decisions, including infection  control decisions, particularly in the presence of clinical signs and  symptoms consistent with COVID-19, or in those who have been in contact with the virus.  Negative results must be combined with clinical observations, patient  history, and epidemiological information. The expected result is Negative.  Fact Sheet for Patients: https://www.jennings-kim.com/  Fact Sheet for Healthcare Providers: https://alexander-rogers.biz/  This test is not yet approved or cleared by the [place] FDA and  has been authorized for detection and/or diagnosis of SARS-CoV-2  by FDA under an Emergency Use Authorization (EUA).  This EUA will remain in effect (meaning this test can be used) for the duration of  the COV                          ID-19 declaration under Section 564(b)(1) of the Act, 21 U.S.C. section 360bbb-3(b)(1), unless the authorization is terminated or revoked sooner.     Cholesterol 07/11/2022 136  0 - 200 mg/dL Final   Triglycerides 09/32/6712 39  <150 mg/dL Final   HDL 45/80/9983 51  >40 mg/dL Final   Total CHOL/HDL Ratio 07/11/2022 2.7  RATIO Final   VLDL 07/11/2022 8  0 - 40 mg/dL Final   LDL Cholesterol 07/11/2022 77  0 - 99 mg/dL Final   Comment:        Total Cholesterol/HDL:CHD Risk Coronary Heart Disease Risk Table                     Men   Women  1/2 Average Risk   3.4   3.3  Average Risk       5.0   4.4  2 X Average Risk   9.6   7.1  3 X Average Risk  23.4   11.0        Use the calculated Patient Ratio above and the CHD Risk Table to determine the patient's CHD Risk.        ATP III CLASSIFICATION (LDL):  <100     mg/dL   Optimal  382-505  mg/dL   Near or Above                    Optimal  130-159  mg/dL   Borderline  397-673  mg/dL   High  >419     mg/dL   Very High Performed at [facility], [address] [place], [place] [address]     Allergies: Patient has no known allergies.  PTA Medications: (Not in a hospital admission)   Medical Decision Making  Inpatient observation  Lab Orders         Resp Panel by RT-PCR (Flu A&B, Covid) Anterior Nasal Swab         CBC with Differential/Platelet         Comprehensive metabolic panel         Hemoglobin A1c         Lipid panel         POCT Urine Drug Screen - (I-Screen)         POC SARS Coronavirus 2 Ag      Meds ordered this encounter  Medications   acetaminophen (TYLENOL) tablet 650 mg   alum & mag hydroxide-simeth (MAALOX/MYLANTA) 200-200-20 MG/5ML suspension 30 mL   magnesium hydroxide (MILK OF MAGNESIA) suspension 30 mL   carbamazepine (TEGRETOL)  tablet 200 mg   ARIPiprazole (ABILIFY) tablet 5 mg       Recommendations  Based on my evaluation the patient appears to have an emergency medical condition for which I recommend the patient be transferred to the emergency department for further evaluation.  Sindy Guadeloupe, NP 07/11/22  5:27 AM
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
## 2023-02-19 ENCOUNTER — Encounter (HOSPITAL_COMMUNITY): Payer: Self-pay

## 2023-02-19 ENCOUNTER — Other Ambulatory Visit: Payer: Self-pay

## 2023-02-19 ENCOUNTER — Emergency Department (HOSPITAL_COMMUNITY): Payer: 59

## 2023-02-19 ENCOUNTER — Emergency Department (HOSPITAL_COMMUNITY)
Admission: EM | Admit: 2023-02-19 | Discharge: 2023-02-19 | Disposition: A | Discharge status: Home / Self Care | Payer: 59 | Attending: Emergency Medicine | Admitting: Emergency Medicine

## 2023-02-19 DIAGNOSIS — M25561 Pain in right knee: Secondary | ICD-10-CM | POA: Diagnosis not present

## 2023-02-19 DIAGNOSIS — Y92410 Unspecified street and highway as the place of occurrence of the external cause: Secondary | ICD-10-CM | POA: Diagnosis not present

## 2023-02-19 DIAGNOSIS — M791 Myalgia, unspecified site: Secondary | ICD-10-CM | POA: Diagnosis not present

## 2023-02-19 DIAGNOSIS — S80211A Abrasion, right knee, initial encounter: Secondary | ICD-10-CM | POA: Diagnosis not present

## 2023-02-19 DIAGNOSIS — Z041 Encounter for examination and observation following transport accident: Secondary | ICD-10-CM | POA: Diagnosis not present

## 2023-02-19 DIAGNOSIS — S8991XA Unspecified injury of right lower leg, initial encounter: Secondary | ICD-10-CM | POA: Diagnosis not present

## 2023-02-19 DIAGNOSIS — M7918 Myalgia, other site: Secondary | ICD-10-CM

## 2023-02-19 MED ORDER — MELOXICAM 7.5 MG PO TABS
7.5000 mg | ORAL_TABLET | Freq: Once | ORAL | Status: AC
  Administered 2023-02-19: 7.5 mg via ORAL
  Filled 2023-02-19: qty 1

## 2023-02-19 MED ORDER — MELOXICAM 7.5 MG PO TABS: 7.5000 mg | ORAL_TABLET | Freq: Every day | ORAL | 0 refills | Status: AC

## 2023-02-19 MED ORDER — MELOXICAM 7.5 MG PO TABS: 7.5000 mg | ORAL_TABLET | Freq: Every day | ORAL | 0 refills | Status: DC

## 2023-02-19 NOTE — ED Notes (Signed)
Right knee cleaned and wrapped
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
## 2023-02-19 NOTE — Discharge Instructions (Addendum)
Your imaging shows no bone breaks or dislocation. Take Meloxicam as needed for pain. You will most likely be sore for several days. Alternate between heat and ice for muscle pain.  Follow-up with primary care provider in 1 to 2 days for reevaluation.  If you do not have a primary care provider there is a phone number that will be in the discharge summary that you can call to be established with one.  Return to ED if: You have trouble breathing. You have trouble swallowing. You have muscle pain along with a stiff neck, fever, and vomiting. You have severe muscle weakness, or you cannot move part of your body. You are urinating less, or you have dark, bloody, or discolored urine. You have redness or swelling at the site of the muscle pain.
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
## 2023-03-04 ENCOUNTER — Encounter: Payer: Self-pay | Admitting: Internal Medicine

## 2023-03-04 ENCOUNTER — Ambulatory Visit (INDEPENDENT_AMBULATORY_CARE_PROVIDER_SITE_OTHER): Payer: MEDICAID | Admitting: Internal Medicine

## 2023-03-04 VITALS — BP 115/70 | HR 63 | Ht 71.0 in | Wt 162.0 lb

## 2023-03-04 DIAGNOSIS — F319 Bipolar disorder, unspecified: Secondary | ICD-10-CM | POA: Diagnosis not present

## 2023-03-04 MED ORDER — FLUOXETINE HCL 20 MG PO CAPS
20.0000 mg | ORAL_CAPSULE | Freq: Every morning | ORAL | 1 refills | Status: DC
Start: 2023-03-04 — End: 2023-06-10

## 2023-03-04 MED ORDER — ARIPIPRAZOLE 5 MG PO TABS: 5.0000 mg | ORAL_TABLET | Freq: Every day | ORAL | 0 refills | Status: DC

## 2023-03-04 NOTE — Progress Notes (Signed)
    HPI:Mr.Edward [name] is a 22 y.o. male with PMHx of Oppositional defiant disorder, ADHD , and Bipolar 1 Disorder who presents to establish care. He was living a Sport and exercise psychologist. His understanding is his primary doctor was managing his psychiatric medications. On review of Care everywhere notes it shows he was following with psychiatry.  He had normal lipid panel and hemoglobin A1c 8 months ago.  He reports increased depression and anxiety off of Abilify and fluoxetine.   Past Medical History:  Diagnosis Date   ADHD    Bipolar 1 disorder (HCC)    ODD (oppositional defiant disorder)     History reviewed. No pertinent surgical history.  History reviewed. No pertinent family history.  Social History   Tobacco Use   Smoking status: Never   Smokeless tobacco: Never  Substance Use Topics   Alcohol use: No   Drug use: No    Physical Exam: Vitals:   03/04/23 1411  BP: 115/70  Pulse: 63  SpO2: 95%  Weight: 162 lb 0.6 oz (73.5 kg)  Height: 5\' 11"  (1.803 m)     Physical Exam Constitutional:      General: He is not in acute distress.    Appearance: He is well-developed and well-groomed.  Eyes:     General: No scleral icterus.    Conjunctiva/sclera: Conjunctivae normal.  Cardiovascular:     Rate and Rhythm: Normal rate and regular rhythm.     Heart sounds: No murmur heard.    No friction rub. No gallop.  Pulmonary:     Effort: Pulmonary effort is normal.     Breath sounds: No wheezing, rhonchi or rales.  Musculoskeletal:     Right lower leg: No edema.     Left lower leg: No edema.  Skin:    General: Skin is warm and dry.  Psychiatric:        Mood and Affect: Affect normal. Mood is anxious and depressed.        Speech: Speech normal.        Behavior: Behavior normal.        Thought Content: Thought content normal.      Assessment & Plan:   Bipolar 1 disorder (HCC) Patient has feeling of  depression and anxiety since being off of Abilify and  fluoxetine. Gad 7 score 12 and PHQ-9 score 15. Chronic problem , uncontrolled Restart Abilify 5 mg Restart Fluoxetine 20 mg every day Request previous records Referral to [facility] Follow up in 6 weeks    Milus Banister, MD

## 2023-03-04 NOTE — Patient Instructions (Signed)
Thank you, Mr.Savan Outen for allowing [place] to provide your care today.   Medications sent to pharmacy   Reminders: Follow up in 6 weeks or sooner if needed.     Thurmon Fair, M.D.

## 2023-03-09 DIAGNOSIS — F319 Bipolar disorder, unspecified: Secondary | ICD-10-CM | POA: Insufficient documentation

## 2023-03-09 NOTE — Assessment & Plan Note (Addendum)
Patient has feeling of  depression and anxiety since being off of Abilify and fluoxetine. Gad 7 score 12 and PHQ-9 score 15. Chronic problem , uncontrolled Restart Abilify 5 mg Restart Fluoxetine 20 mg every day Request previous records Referral to [facility] Follow up in 6 weeks

## 2023-04-15 ENCOUNTER — Ambulatory Visit: Payer: 59 | Admitting: Internal Medicine

## 2023-04-16 ENCOUNTER — Encounter: Payer: Self-pay | Admitting: Internal Medicine

## 2023-04-25 ENCOUNTER — Institutional Professional Consult (permissible substitution): Payer: 59 | Admitting: Professional Counselor

## 2023-06-10 ENCOUNTER — Encounter: Payer: Self-pay | Admitting: Internal Medicine

## 2023-06-10 ENCOUNTER — Ambulatory Visit (INDEPENDENT_AMBULATORY_CARE_PROVIDER_SITE_OTHER): Payer: 59 | Admitting: Internal Medicine

## 2023-06-10 VITALS — BP 112/66 | HR 63 | Ht 71.0 in | Wt 166.2 lb

## 2023-06-10 DIAGNOSIS — F3481 Disruptive mood dysregulation disorder: Secondary | ICD-10-CM

## 2023-06-10 DIAGNOSIS — Z23 Encounter for immunization: Secondary | ICD-10-CM

## 2023-06-10 DIAGNOSIS — F913 Oppositional defiant disorder: Secondary | ICD-10-CM | POA: Diagnosis not present

## 2023-06-10 DIAGNOSIS — F319 Bipolar disorder, unspecified: Secondary | ICD-10-CM | POA: Diagnosis not present

## 2023-06-10 MED ORDER — FLUOXETINE HCL 20 MG PO CAPS: 20.0000 mg | ORAL_CAPSULE | Freq: Every morning | ORAL | 1 refills | Status: DC

## 2023-06-10 MED ORDER — ARIPIPRAZOLE 5 MG PO TABS: 5.0000 mg | ORAL_TABLET | Freq: Every day | ORAL | 1 refills | Status: DC

## 2023-06-10 NOTE — Progress Notes (Signed)
Established Patient Office Visit  Subjective   Patient ID: Edward [name], male    DOB: 06-19-2001  Age: 22 y.o. MRN: [id number]  Chief Complaint  Patient presents with   ADHD    Follow up , would like a medication change   Edward [name] returns to care today for routine follow-up.  He was last evaluated at [facility] on 7/2 by Dr. Barbaraann Faster as a new patient presenting to establish care.  At that time he reported increased anxiety and depression while off of Abilify and fluoxetine.  Both medications were restarted, he was referred to integrated behavioral health, and 6-week follow-up was arranged.  There have been no acute interval events.  Today Edward [name] acute concern remains poorly controlled anxiety and depression.  He states that he is experiencing difficulty concentrating, restlessness, insomnia, and does not care to participate in activities that he previously enjoyed.  PHQ-9 and GAD-7 scores are both elevated today.  He denies SI/HI.  He states that he resumed fluoxetine in July after his appointment with Dr. Barbaraann Faster, but has not been taking Abilify.  He is out of fluoxetine currently.  Past Medical History:  Diagnosis Date   ADHD    Bipolar 1 disorder (HCC)    ODD (oppositional defiant disorder)    No past surgical history on file. Social History   Tobacco Use   Smoking status: Never   Smokeless tobacco: Never  Substance Use Topics   Alcohol use: No   Drug use: No   No family history on file. No Known Allergies  Review of Systems  Psychiatric/Behavioral:  Positive for depression. Negative for hallucinations, memory loss, substance abuse and suicidal ideas. The patient is nervous/anxious and has insomnia.   All other systems reviewed and are negative.    Objective:     BP 112/66 (BP Location: Right Arm, Patient Position: Sitting, Cuff Size: Normal)   Pulse 63   Ht 5\' 11"  (1.803 m)   Wt 166 lb 3.2 oz (75.4 kg)   SpO2 96%   BMI 23.18 kg/m  BP Readings from Last 3 Encounters:   06/10/23 112/66  03/04/23 115/70  02/19/23 100/75   Physical Exam Vitals reviewed.  Constitutional:      General: He is not in acute distress.    Appearance: Normal appearance. He is not ill-appearing.  HENT:     Head: Normocephalic and atraumatic.     Right Ear: External ear normal.     Left Ear: External ear normal.     Nose: Nose normal. No congestion or rhinorrhea.     Mouth/Throat:     Mouth: Mucous membranes are moist.     Pharynx: Oropharynx is clear.  Eyes:     General: No scleral icterus.    Extraocular Movements: Extraocular movements intact.     Conjunctiva/sclera: Conjunctivae normal.     Pupils: Pupils are equal, round, and reactive to light.  Cardiovascular:     Rate and Rhythm: Normal rate and regular rhythm.     Pulses: Normal pulses.     Heart sounds: Normal heart sounds. No murmur heard. Pulmonary:     Effort: Pulmonary effort is normal.     Breath sounds: Normal breath sounds. No wheezing, rhonchi or rales.  Abdominal:     General: Abdomen is flat. Bowel sounds are normal. There is no distension.     Palpations: Abdomen is soft.     Tenderness: There is no abdominal tenderness.  Musculoskeletal:  General: No swelling or deformity. Normal range of motion.     Cervical back: Normal range of motion.  Skin:    General: Skin is warm and dry.     Capillary Refill: Capillary refill takes less than 2 seconds.  Neurological:     General: No focal deficit present.     Mental Status: He is alert and oriented to person, place, and time.     Motor: No weakness.  Psychiatric:        Mood and Affect: Mood is anxious and depressed.        Thought Content: Thought content normal.   Last CBC Lab Results  Component Value Date   WBC 6.2 07/11/2022   HGB 15.5 07/11/2022   HCT 44.4 07/11/2022   MCV 82.5 07/11/2022   MCH 28.8 07/11/2022   RDW 13.9 07/11/2022   PLT 228 07/11/2022   Last metabolic panel Lab Results  Component Value Date   GLUCOSE 124 (H)  07/11/2022   NA 136 07/11/2022   K 3.3 (L) 07/11/2022   CL 98 07/11/2022   CO2 26 07/11/2022   BUN 9 07/11/2022   CREATININE 0.86 07/11/2022   GFRNONAA >60 07/11/2022   CALCIUM 9.9 07/11/2022   PROT 7.8 07/11/2022   ALBUMIN 4.3 07/11/2022   BILITOT 2.0 (H) 07/11/2022   ALKPHOS 43 07/11/2022   AST 26 07/11/2022   ALT 15 07/11/2022   ANIONGAP 12 07/11/2022   Last lipids Lab Results  Component Value Date   CHOL 136 07/11/2022   HDL 51 07/11/2022   LDLCALC 77 07/11/2022   TRIG 39 07/11/2022   CHOLHDL 2.7 07/11/2022   Last hemoglobin A1c Lab Results  Component Value Date   HGBA1C 5.3 07/11/2022     Assessment & Plan:   Problem List Items Addressed This Visit       Bipolar 1 disorder (HCC)    Returning to care today for follow-up.  As described above, he was last evaluated as a new patient presenting to establish care on 7/2 by Dr. Barbaraann Faster.  He endorsed poorly controlled symptoms of depression and anxiety at that time since being off of Abilify and fluoxetine.  Both medications were restarted.  Today he continues to endorse poorly controlled symptoms of anxiety and depression.  PHQ-9 and GAD-7 scores are elevated.  Denies SI/HI.  Reports that he restarted fluoxetine after his appointment in July but did not restart Abilify.  Per review of medication dispense report, fluoxetine was last filled as a 30-day supply on 9/20.  Abilify has not been refilled. -Fluoxetine refilled today is a 90-day supply.  He remains in agreement with resuming Abilify.  This has also been filled today with a 90-day supply. -Given persistent symptoms and inconsistency with medications, he has been referred to psychiatry. -We will tentatively plan for follow-up in 3 months      Need for influenza vaccination    Influenza vaccine administered today      Return in about 3 months (around 09/10/2023).   Billie Lade, MD

## 2023-06-10 NOTE — Patient Instructions (Signed)
It was a pleasure to see you today.  Thank you for giving [place] the opportunity to be involved in your care.  Below is a brief recap of your visit and next steps.  We will plan to see you again in 3 months.  Summary Resume Abilify  Prozac refilled Flu shot today Psychiatry referral placed Follow up in 3 months

## 2023-07-06 ENCOUNTER — Encounter: Payer: Self-pay | Admitting: Internal Medicine

## 2023-07-06 DIAGNOSIS — Z23 Encounter for immunization: Secondary | ICD-10-CM | POA: Insufficient documentation

## 2023-07-06 NOTE — Assessment & Plan Note (Signed)
Influenza vaccine administered today.

## 2023-07-06 NOTE — Assessment & Plan Note (Signed)
Returning to care today for follow-up.  As described above, he was last evaluated as a new patient presenting to establish care on 7/2 by Dr. Barbaraann Faster.  He endorsed poorly controlled symptoms of depression and anxiety at that time since being off of Abilify and fluoxetine.  Both medications were restarted.  Today he continues to endorse poorly controlled symptoms of anxiety and depression.  PHQ-9 and GAD-7 scores are elevated.  Denies SI/HI.  Reports that he restarted fluoxetine after his appointment in July but did not restart Abilify.  Per review of medication dispense report, fluoxetine was last filled as a 30-day supply on 9/20.  Abilify has not been refilled. -Fluoxetine refilled today is a 90-day supply.  He remains in agreement with resuming Abilify.  This has also been filled today with a 90-day supply. -Given persistent symptoms and inconsistency with medications, he has been referred to psychiatry. -We will tentatively plan for follow-up in 3 months

## 2023-08-04 DIAGNOSIS — F319 Bipolar disorder, unspecified: Secondary | ICD-10-CM | POA: Diagnosis not present

## 2023-08-12 ENCOUNTER — Emergency Department (HOSPITAL_COMMUNITY): Admission: EM | Admit: 2023-08-12 | Discharge: 2023-08-12 | Discharge status: Against Medical Advice | Payer: 59

## 2023-08-12 NOTE — ED Triage Notes (Signed)
Pt called x2 no answer 

## 2023-08-12 NOTE — ED Triage Notes (Signed)
Pt called x one, no answer

## 2023-08-13 ENCOUNTER — Other Ambulatory Visit: Payer: Self-pay

## 2023-08-13 ENCOUNTER — Encounter (HOSPITAL_COMMUNITY): Payer: Self-pay | Admitting: *Deleted

## 2023-08-13 ENCOUNTER — Emergency Department (HOSPITAL_COMMUNITY): Payer: 59

## 2023-08-13 ENCOUNTER — Emergency Department (HOSPITAL_COMMUNITY)
Admission: EM | Admit: 2023-08-13 | Discharge: 2023-08-13 | Disposition: A | Discharge status: Home / Self Care | Payer: 59 | Attending: Emergency Medicine | Admitting: Emergency Medicine

## 2023-08-13 DIAGNOSIS — Y9355 Activity, bike riding: Secondary | ICD-10-CM | POA: Diagnosis not present

## 2023-08-13 DIAGNOSIS — M25561 Pain in right knee: Secondary | ICD-10-CM | POA: Diagnosis not present

## 2023-08-13 DIAGNOSIS — S82091A Other fracture of right patella, initial encounter for closed fracture: Secondary | ICD-10-CM | POA: Diagnosis not present

## 2023-08-13 DIAGNOSIS — Y92410 Unspecified street and highway as the place of occurrence of the external cause: Secondary | ICD-10-CM | POA: Diagnosis not present

## 2023-08-13 DIAGNOSIS — S80211A Abrasion, right knee, initial encounter: Secondary | ICD-10-CM | POA: Diagnosis not present

## 2023-08-13 MED ORDER — IBUPROFEN 400 MG PO TABS
400.0000 mg | ORAL_TABLET | Freq: Once | ORAL | Status: AC
  Administered 2023-08-13: 400 mg via ORAL
  Filled 2023-08-13: qty 1
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
## 2023-08-13 NOTE — Discharge Instructions (Signed)
As discussed the x-ray negative for any fracture or dislocation.  Recommend continued use of elevation of leg above the level of your heart up with swelling as well as taking of ibuprofen for pain/inflammation.  Use crutches as needed to help aid in getting around.  Recommend calling number attached to your discharge papers to schedule an appointment with the orthopedic doctor.  Please do not hesitate to return if the worrisome signs and symptoms we discussed become apparent.

## 2023-08-26 ENCOUNTER — Inpatient Hospital Stay (HOSPITAL_COMMUNITY)
Admission: AD | Admit: 2023-08-26 | Discharge: 2023-09-01 | DRG: 885 | Disposition: A | Discharge status: Home / Self Care | Payer: 59 | Source: Intra-hospital | Attending: Psychiatry | Admitting: Psychiatry

## 2023-08-26 ENCOUNTER — Encounter (HOSPITAL_COMMUNITY): Payer: Self-pay | Admitting: Adult Health

## 2023-08-26 ENCOUNTER — Other Ambulatory Visit: Payer: Self-pay

## 2023-08-26 ENCOUNTER — Emergency Department (HOSPITAL_COMMUNITY)
Admission: EM | Admit: 2023-08-26 | Discharge: 2023-08-26 | Disposition: A | Discharge status: Other Acute Inpatient Hospital | Payer: 59 | Attending: Emergency Medicine | Admitting: Emergency Medicine

## 2023-08-26 ENCOUNTER — Encounter (HOSPITAL_COMMUNITY): Payer: Self-pay | Admitting: Emergency Medicine

## 2023-08-26 DIAGNOSIS — F3113 Bipolar disorder, current episode manic without psychotic features, severe: Secondary | ICD-10-CM | POA: Diagnosis not present

## 2023-08-26 DIAGNOSIS — F913 Oppositional defiant disorder: Secondary | ICD-10-CM | POA: Diagnosis not present

## 2023-08-26 DIAGNOSIS — G479 Sleep disorder, unspecified: Secondary | ICD-10-CM | POA: Diagnosis present

## 2023-08-26 DIAGNOSIS — E876 Hypokalemia: Secondary | ICD-10-CM | POA: Insufficient documentation

## 2023-08-26 DIAGNOSIS — Z560 Unemployment, unspecified: Secondary | ICD-10-CM

## 2023-08-26 DIAGNOSIS — F319 Bipolar disorder, unspecified: Secondary | ICD-10-CM | POA: Insufficient documentation

## 2023-08-26 DIAGNOSIS — Z833 Family history of diabetes mellitus: Secondary | ICD-10-CM

## 2023-08-26 DIAGNOSIS — E559 Vitamin D deficiency, unspecified: Secondary | ICD-10-CM | POA: Diagnosis present

## 2023-08-26 DIAGNOSIS — F3481 Disruptive mood dysregulation disorder: Secondary | ICD-10-CM | POA: Diagnosis present

## 2023-08-26 DIAGNOSIS — R45851 Suicidal ideations: Secondary | ICD-10-CM | POA: Insufficient documentation

## 2023-08-26 DIAGNOSIS — R4689 Other symptoms and signs involving appearance and behavior: Secondary | ICD-10-CM | POA: Diagnosis not present

## 2023-08-26 DIAGNOSIS — Z91148 Patient's other noncompliance with medication regimen for other reason: Secondary | ICD-10-CM

## 2023-08-26 DIAGNOSIS — Z5900 Homelessness unspecified: Secondary | ICD-10-CM | POA: Diagnosis not present

## 2023-08-26 DIAGNOSIS — F909 Attention-deficit hyperactivity disorder, unspecified type: Secondary | ICD-10-CM | POA: Diagnosis present

## 2023-08-26 DIAGNOSIS — R451 Restlessness and agitation: Secondary | ICD-10-CM | POA: Diagnosis not present

## 2023-08-26 HISTORY — DX: Mild intellectual disabilities: F70

## 2023-08-26 LAB — CBC WITH DIFFERENTIAL/PLATELET
Abs Immature Granulocytes: 0.01 10*3/uL (ref 0.00–0.07)
Basophils Absolute: 0 10*3/uL (ref 0.0–0.1)
Basophils Relative: 1 %
Eosinophils Absolute: 0.2 10*3/uL (ref 0.0–0.5)
Eosinophils Relative: 3 %
HCT: 41.9 % (ref 39.0–52.0)
Hemoglobin: 14.2 g/dL (ref 13.0–17.0)
Immature Granulocytes: 0 %
Lymphocytes Relative: 26 %
Lymphs Abs: 1.7 10*3/uL (ref 0.7–4.0)
MCH: 28.8 pg (ref 26.0–34.0)
MCHC: 33.9 g/dL (ref 30.0–36.0)
MCV: 85 fL (ref 80.0–100.0)
Monocytes Absolute: 0.7 10*3/uL (ref 0.1–1.0)
Monocytes Relative: 11 %
Neutro Abs: 3.9 10*3/uL (ref 1.7–7.7)
Neutrophils Relative %: 59 %
Platelets: 287 10*3/uL (ref 150–400)
RBC: 4.93 MIL/uL (ref 4.22–5.81)
RDW: 13.7 % (ref 11.5–15.5)
WBC: 6.5 10*3/uL (ref 4.0–10.5)
nRBC: 0 % (ref 0.0–0.2)

## 2023-08-26 LAB — ETHANOL: Alcohol, Ethyl (B): <10 mg/dL (ref <10)

## 2023-08-26 LAB — BASIC METABOLIC PANEL
Anion gap: 13 (ref 5–15)
BUN: 11 mg/dL (ref 6–20)
CO2: 25 mmol/L (ref 22–32)
Calcium: 9.3 mg/dL (ref 8.9–10.3)
Chloride: 100 mmol/L (ref 98–111)
Creatinine, Ser: 0.93 mg/dL (ref 0.61–1.24)
GFR, Estimated: >60 mL/min (ref >60)
Glucose, Bld: 89 mg/dL (ref 70–99)
Potassium: 3.5 mmol/L (ref 3.5–5.1)
Sodium: 138 mmol/L (ref 135–145)

## 2023-08-26 LAB — COMPREHENSIVE METABOLIC PANEL
ALT: 18 U/L (ref 0–44)
AST: 32 U/L (ref 15–41)
Albumin: 4.1 g/dL (ref 3.5–5.0)
Alkaline Phosphatase: 43 U/L (ref 38–126)
Anion gap: 12 (ref 5–15)
BUN: 11 mg/dL (ref 6–20)
CO2: 23 mmol/L (ref 22–32)
Calcium: 9.3 mg/dL (ref 8.9–10.3)
Chloride: 100 mmol/L (ref 98–111)
Creatinine, Ser: 1 mg/dL (ref 0.61–1.24)
GFR, Estimated: >60 mL/min (ref >60)
Glucose, Bld: 115 mg/dL — ABNORMAL HIGH (ref 70–99)
Potassium: 2.8 mmol/L — ABNORMAL LOW (ref 3.5–5.1)
Sodium: 135 mmol/L (ref 135–145)
Total Bilirubin: 0.9 mg/dL (ref <1.2)
Total Protein: 7.7 g/dL (ref 6.5–8.1)

## 2023-08-26 LAB — ACETAMINOPHEN LEVEL: Acetaminophen (Tylenol), Serum: <10 ug/mL — ABNORMAL LOW (ref 10–30)

## 2023-08-26 LAB — SALICYLATE LEVEL: Salicylate Lvl: <7 mg/dL — ABNORMAL LOW (ref 7.0–30.0)

## 2023-08-26 MED ORDER — ARIPIPRAZOLE 5 MG PO TABS
5.0000 mg | ORAL_TABLET | Freq: Every day | ORAL | Status: DC
  Administered 2023-08-27: 5 mg via ORAL
  Filled 2023-08-26 (×3): qty 1

## 2023-08-26 MED ORDER — POTASSIUM CHLORIDE 10 MEQ/100ML IV SOLN
10.0000 meq | INTRAVENOUS | Status: AC
Start: 2023-08-26 — End: 2023-08-26
  Administered 2023-08-26 (×2): 10 meq via INTRAVENOUS
  Filled 2023-08-26 (×2): qty 100

## 2023-08-26 MED ORDER — DIPHENHYDRAMINE HCL 50 MG/ML IJ SOLN
50.0000 mg | Freq: Three times a day (TID) | INTRAMUSCULAR | Status: DC | PRN
  Administered 2023-08-27: 50 mg via INTRAMUSCULAR
  Filled 2023-08-26: qty 1

## 2023-08-26 MED ORDER — HALOPERIDOL LACTATE 5 MG/ML IJ SOLN
INTRAMUSCULAR | Status: AC
  Filled 2023-08-26: qty 1

## 2023-08-26 MED ORDER — ACETAMINOPHEN 325 MG PO TABS: 650.0000 mg | ORAL_TABLET | ORAL | Status: DC | PRN

## 2023-08-26 MED ORDER — FLUOXETINE HCL 20 MG PO CAPS
20.0000 mg | ORAL_CAPSULE | Freq: Every morning | ORAL | Status: DC
  Administered 2023-08-27: 20 mg via ORAL
  Filled 2023-08-26 (×3): qty 1

## 2023-08-26 MED ORDER — DIPHENHYDRAMINE HCL 50 MG/ML IJ SOLN: 50.0000 mg | Freq: Once | INTRAMUSCULAR | Status: AC

## 2023-08-26 MED ORDER — FLUOXETINE HCL 20 MG PO CAPS
20.0000 mg | ORAL_CAPSULE | Freq: Every morning | ORAL | Status: DC
Start: 2023-08-26 — End: 2023-08-26
  Administered 2023-08-26: 20 mg via ORAL
  Filled 2023-08-26: qty 1

## 2023-08-26 MED ORDER — TRAZODONE HCL 50 MG PO TABS
50.0000 mg | ORAL_TABLET | Freq: Every evening | ORAL | Status: DC | PRN
  Administered 2023-08-26 – 2023-08-31 (×6): 50 mg via ORAL
  Filled 2023-08-26 (×4): qty 1
  Filled 2023-08-26: qty 7
  Filled 2023-08-26 (×2): qty 1

## 2023-08-26 MED ORDER — ONDANSETRON HCL 4 MG PO TABS
4.0000 mg | ORAL_TABLET | Freq: Three times a day (TID) | ORAL | Status: DC | PRN
Start: 2023-08-26 — End: 2023-08-26

## 2023-08-26 MED ORDER — HYDROXYZINE HCL 25 MG PO TABS
25.0000 mg | ORAL_TABLET | Freq: Three times a day (TID) | ORAL | Status: DC | PRN
  Administered 2023-08-26 – 2023-08-31 (×10): 25 mg via ORAL
  Filled 2023-08-26 (×10): qty 1
  Filled 2023-08-26: qty 20

## 2023-08-26 MED ORDER — LORAZEPAM 2 MG/ML IJ SOLN: 2.0000 mg | Freq: Once | INTRAMUSCULAR | Status: AC

## 2023-08-26 MED ORDER — LORAZEPAM 1 MG PO TABS: 1.0000 mg | ORAL_TABLET | ORAL | Status: DC | PRN

## 2023-08-26 MED ORDER — HALOPERIDOL LACTATE 5 MG/ML IJ SOLN
5.0000 mg | Freq: Three times a day (TID) | INTRAMUSCULAR | Status: DC | PRN
  Administered 2023-08-27: 5 mg via INTRAMUSCULAR
  Filled 2023-08-26: qty 1

## 2023-08-26 MED ORDER — LORAZEPAM 2 MG/ML IJ SOLN
2.0000 mg | Freq: Three times a day (TID) | INTRAMUSCULAR | Status: DC | PRN
  Administered 2023-08-27: 2 mg via INTRAMUSCULAR
  Filled 2023-08-26: qty 1

## 2023-08-26 MED ORDER — ONDANSETRON HCL 4 MG PO TABS
4.0000 mg | ORAL_TABLET | Freq: Three times a day (TID) | ORAL | Status: DC | PRN
Start: 2023-08-26 — End: 2023-09-01

## 2023-08-26 MED ORDER — LORAZEPAM 2 MG/ML IJ SOLN
INTRAMUSCULAR | Status: AC
  Administered 2023-08-26: 2 mg via INTRAMUSCULAR
  Filled 2023-08-26: qty 1

## 2023-08-26 MED ORDER — DIPHENHYDRAMINE HCL 50 MG/ML IJ SOLN
INTRAMUSCULAR | Status: AC
  Administered 2023-08-26: 50 mg via INTRAVENOUS
  Filled 2023-08-26: qty 1

## 2023-08-26 MED ORDER — ARIPIPRAZOLE 5 MG PO TABS
5.0000 mg | ORAL_TABLET | Freq: Every day | ORAL | Status: DC
  Administered 2023-08-26: 5 mg via ORAL
  Filled 2023-08-26: qty 1

## 2023-08-26 MED ORDER — HALOPERIDOL LACTATE 5 MG/ML IJ SOLN
5.0000 mg | Freq: Once | INTRAMUSCULAR | Status: AC
  Administered 2023-08-26: 5 mg via INTRAMUSCULAR

## 2023-08-26 MED ORDER — ALUM & MAG HYDROXIDE-SIMETH 200-200-20 MG/5ML PO SUSP: 30.0000 mL | ORAL | Status: DC | PRN

## 2023-08-26 MED ORDER — ACETAMINOPHEN 325 MG PO TABS
650.0000 mg | ORAL_TABLET | Freq: Four times a day (QID) | ORAL | Status: DC | PRN
  Administered 2023-08-27 – 2023-08-30 (×2): 650 mg via ORAL
  Filled 2023-08-26 (×2): qty 2

## 2023-08-26 MED ORDER — RISPERIDONE 1 MG PO TBDP: 2.0000 mg | ORAL_TABLET | Freq: Three times a day (TID) | ORAL | Status: DC | PRN

## 2023-08-26 MED ORDER — MAGNESIUM HYDROXIDE 400 MG/5ML PO SUSP: 30.0000 mL | Freq: Every day | ORAL | Status: DC | PRN

## 2023-08-26 MED ORDER — ZIPRASIDONE MESYLATE 20 MG IM SOLR: 20.0000 mg | INTRAMUSCULAR | Status: DC | PRN
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
## 2023-08-26 NOTE — ED Notes (Signed)
Wanded by security 

## 2023-08-26 NOTE — ED Notes (Signed)
ED Provider at bedside. 

## 2023-08-26 NOTE — ED Notes (Signed)
Patient dressed out in purple scrubs 
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
## 2023-08-26 NOTE — ED Triage Notes (Addendum)
Pt bib [employer] per officer family called because pt was acting erratically, laying outside screaming. Pt screaming "I gave up, I don't care no more, this is all because of my sister".   Pt will not answer any questions in triage, refusing to cooperate. Officers at bedside, pt in forensic restraints. Unable to obtain vitals at this time.

## 2023-08-26 NOTE — ED Notes (Signed)
Pt received lunch tray 

## 2023-08-26 NOTE — ED Notes (Signed)
Attempted TTS but pt does not want to wake up.Marland Kitchen

## 2023-08-26 NOTE — ED Notes (Addendum)
[facility] Edward [name] did attempt to reach out for TTS however due to patient receiving medication earlier tonight, patient is currently asleep. Will try again on dayshift for TTS.

## 2023-08-26 NOTE — ED Notes (Signed)
Pt. On TTS at this time.

## 2023-08-26 NOTE — ED Notes (Signed)
IVC paperwork faxed to [facility] 

## 2023-08-26 NOTE — Plan of Care (Signed)
Care plan initiated.

## 2023-08-26 NOTE — BH Assessment (Signed)
TTS attempted to see pt at 8:45am. Nurse tried to wake pt up but pt acknowledged her and then rolled back over in his bed. Pt would not wake up to complete assessment at this time.   Kanya Potteiger T. Jimmye Norman, MS, [facility], [facility] Triage Specialist [facility]

## 2023-08-26 NOTE — BH Assessment (Addendum)
TTS attempted to complete assessment. Emilee, RN, patient medicated and asleep. TTS assessment will be completed after shift change.

## 2023-08-26 NOTE — ED Provider Notes (Signed)
[place] EMERGENCY DEPARTMENT AT [facility] Provider Note   CSN: [id number] Arrival date & time: 08/26/23  0030     History  Chief Complaint  Patient presents with   Suicidal   Level 5 caveat due to psychiatric disorder Gurfateh Fagerberg is a 22 y.o. male.  The history is provided by the patient and the police.  Patient with history of bipolar, oppositional defiant disorder presents via police Police report they were called out to his home at least 3 times a day due to his behavior against his family.  He was acting erratically and being combative.  On their final visit patient tried to get away and then stopped in the middle of the road and started reporting he wanted to hurt himself.  No trauma is reported  Patient became very aggressive and had to be managed by multiple police officers.  Patient keeps reporting that he "gave up and I do not care anymore "    Past Medical History:  Diagnosis Date   ADHD    Bipolar 1 disorder (HCC)    ODD (oppositional defiant disorder)     Home Medications Prior to Admission medications   Medication Sig Start Date End Date Taking? Authorizing Provider  ARIPiprazole (ABILIFY) 5 MG tablet Take 1 tablet (5 mg total) by mouth daily. 06/10/23   Billie Lade, MD  FLUoxetine (PROZAC) 20 MG capsule Take 1 capsule (20 mg total) by mouth every morning. 06/10/23   Billie Lade, MD      Allergies    Patient has no known allergies.    Review of Systems   Review of Systems  Unable to perform ROS: Psychiatric disorder    Physical Exam Updated Vital Signs BP (!) 115/54   Pulse 73   Temp 98.3 F (36.8 C) (Axillary)   Resp 15   SpO2 94%  Physical Exam CONSTITUTIONAL: Disheveled and anxious and agitated HEAD: Normocephalic/atraumatic, no signs of head trauma EYES: EOMI/PERRL, no nystagmus, pupils pinpoint ENMT: Mucous membranes moist NECK: supple no meningeal signs CV: S1/S2 noted, no murmurs/rubs/gallops noted LUNGS:  Lungs are clear to auscultation bilaterally, no apparent distress ABDOMEN: soft, nontender, no bruising NEURO: Pt is awake/alert, moves all extremitiesx4.  No facial droop.  Patient aggressive and writhing around in the bed, grooming at staff EXTREMITIES: pulses normal/equal, full ROM, no deformities SKIN: warm, color normal PSYCH: Anxious and agitated ED Results / Procedures / Treatments   Labs (all labs ordered are listed, but only abnormal results are displayed) Labs Reviewed  COMPREHENSIVE METABOLIC PANEL - Abnormal; Notable for the following components:      Result Value   Potassium 2.8 (*)    Glucose, Bld 115 (*)    All other components within normal limits  SALICYLATE LEVEL - Abnormal; Notable for the following components:   Salicylate Lvl <7.0 (*)    All other components within normal limits  ACETAMINOPHEN LEVEL - Abnormal; Notable for the following components:   Acetaminophen (Tylenol), Serum <10 (*)    All other components within normal limits  ETHANOL  CBC WITH DIFFERENTIAL/PLATELET  BASIC METABOLIC PANEL  RAPID URINE DRUG SCREEN, HOSP PERFORMED    EKG EKG Interpretation Date/Time:  Tuesday August 26 2023 02:14:51 EST Ventricular Rate:  75 PR Interval:  214 QRS Duration:  95 QT Interval:  369 QTC Calculation: 413 R Axis:   65  Text Interpretation: Sinus rhythm Prolonged PR interval Nonspecific repol abnormality, inferior leads No previous ECGs available Confirmed by Zadie Rhine (  [id number]) on 08/26/2023 2:17:20 AM  Radiology No results found.  Procedures .Critical Care  Performed by: Zadie Rhine, MD Authorized by: Zadie Rhine, MD   Critical care provider statement:    Critical care time (minutes):  40   Critical care start time:  08/26/2023 1:10 AM   Critical care end time:  08/26/2023 1:50 AM   Critical care time was exclusive of:  Separately billable procedures and treating other patients   Critical care was necessary to treat or prevent  imminent or life-threatening deterioration of the following conditions:  CNS failure or compromise and toxidrome   Critical care was time spent personally by me on the following activities:  Examination of patient, ordering and review of laboratory studies, pulse oximetry, review of old charts, re-evaluation of patient's condition, obtaining history from patient or surrogate, evaluation of patient's response to treatment and ordering and performing treatments and interventions   I assumed direction of critical care for this patient from another provider in my specialty: no       Medications Ordered in ED Medications  haloperidol lactate (HALDOL) injection 5 mg (5 mg Intramuscular Given 08/26/23 0101)  LORazepam (ATIVAN) injection 2 mg (2 mg Intramuscular Given 08/26/23 0102)  diphenhydrAMINE (BENADRYL) injection 50 mg (50 mg Intravenous Given 08/26/23 0102)  potassium chloride 10 mEq in 100 mL IVPB (0 mEq Intravenous Stopped 08/26/23 0431)    ED Course/ Medical Decision Making/ A&P Clinical Course as of 08/26/23 0434  Tue Aug 26, 2023  0117 Patient seen on arrival as he arrived via police acting erratically and agitated.  Patient required multiple staff members and law enforcement officers to keep him in the bed.  Patient was a threat to himself and others.  He has been placed in cuffs.  He has been given medications and will be monitored closely [DW]  0158 Patient now resting comfortably, no acute distress [DW]  0208 Potassium(!): 2.8 Hypokalemia [DW]  0233 Patient will be given IV potassium for his hypokalemia.  Patient otherwise stable at this time [DW]  0433 Potassium has improved.  Patient resting comfortably. [DW]  [phone] The patient has been placed in psychiatric observation due to the need to provide a safe environment for the patient while obtaining psychiatric consultation and evaluation, as well as ongoing medical and medication management to treat the patient's condition.  The  patient has been placed under full IVC at this time.   [DW]    Clinical Course User Index [DW] Zadie Rhine, MD                                 Medical Decision Making Amount and/or Complexity of Data Reviewed Labs: ordered. Decision-making details documented in ED Course.  Risk Prescription drug management.   This patient presents to the ED for concern of agitation, this involves an extensive number of treatment options, and is a complaint that carries with it a high risk of complications and morbidity.  The differential diagnosis includes but is not limited to sepsis, meningitis, head trauma, toxidrome, psychosis  Comorbidities that complicate the patient evaluation: Patient's presentation is complicated by their history of bipolar  Social Determinants of Health: Patient's  family stressors   increases the complexity of managing their presentation  Additional history obtained: Additional history obtained from  police Records reviewed  outpatient records  Lab Tests: I Ordered, and personally interpreted labs.  The pertinent results include:  hypokalemia  Cardiac Monitoring: The patient was maintained on a cardiac monitor.  I personally viewed and interpreted the cardiac monitor which showed an underlying rhythm of:  sinus rhythm  Medicines ordered and prescription drug management: I ordered medication including Haldol/Benadryl/Ativan for agitation Reevaluation of the patient after these medicines showed that the patient    improved  Critical Interventions:   Haldol for agitation  Reevaluation: After the interventions noted above, I reevaluated the patient and found that they have :improved  Complexity of problems addressed: Patient's presentation is most consistent with  acute presentation with potential threat to life or bodily function         Final Clinical Impression(s) / ED Diagnoses Final diagnoses:  Suicidal ideation  Agitation    Rx / DC  Orders ED Discharge Orders     None         Zadie Rhine, MD 08/26/23 [phone]

## 2023-08-26 NOTE — ED Notes (Signed)
Belongings inventoried: 1 set of keys on leather keychain 1 apple cell phone  1 license  1 grey puffer jacket 1 black/red hoodie 1 brown long sleeve shirt 1 pair of jeans  1 pair of black shoes 1 pair of nike socks   All placed in belongings bag in locker 1.

## 2023-08-26 NOTE — Progress Notes (Signed)
Edward [name] did not attend wrap up group.

## 2023-08-26 NOTE — Tx Team (Signed)
Initial Treatment Plan 08/26/2023 7:17 PM Edward [name] ZOX:[id number]    PATIENT STRESSORS: Loss of housing    PATIENT STRENGTHS: Ability for insight  Communication skills    PATIENT IDENTIFIED PROBLEMS: "I am homeless"  "I don't have any support"                   DISCHARGE CRITERIA:  Improved stabilization in mood, thinking, and/or behavior  PRELIMINARY DISCHARGE PLAN: Return to previous living arrangement  PATIENT/FAMILY INVOLVEMENT: This treatment plan has been presented to and reviewed with the patient, Edward [name], and/or family member, .  The patient and family have been given the opportunity to ask questions and make suggestions.  Jerrye Bushy, RN 08/26/2023,

## 2023-08-26 NOTE — ED Notes (Addendum)
Pt is IVC'd and needs faxed.

## 2023-08-26 NOTE — Plan of Care (Signed)
  Problem: Activity: Goal: Sleeping patterns will improve Outcome: Progressing   Problem: Safety: Goal: Periods of time without injury will increase Outcome: Progressing   

## 2023-08-27 ENCOUNTER — Encounter (HOSPITAL_COMMUNITY): Payer: Self-pay

## 2023-08-27 DIAGNOSIS — F3113 Bipolar disorder, current episode manic without psychotic features, severe: Principal | ICD-10-CM | POA: Diagnosis present

## 2023-08-27 MED ORDER — ARIPIPRAZOLE 10 MG PO TABS
10.0000 mg | ORAL_TABLET | Freq: Every day | ORAL | Status: DC
  Administered 2023-08-28 – 2023-08-29 (×2): 10 mg via ORAL
  Filled 2023-08-27 (×4): qty 1

## 2023-08-27 NOTE — Plan of Care (Signed)
  Problem: Education: Goal: Mental status will improve Outcome: Not Progressing   Problem: Activity: Goal: Interest or engagement in activities will improve Outcome: Not Progressing   Problem: Health Behavior/Discharge Planning: Goal: Compliance with treatment plan for underlying cause of condition will improve Outcome: Progressing   Problem: Safety: Goal: Periods of time without injury will increase Outcome: Progressing

## 2023-08-27 NOTE — Progress Notes (Signed)
Patient noticeably becoming agitated in room despite being 1:1. Patient started out fidgety/anxious and then began peeling the paint off the wall. Patient redirected at this time but soon after began to tap his head on the wall. Patient again redirected but patient at this time becoming increasingly anxious/agitated stating he has nothing left to live for. Patient stating "I don't care what happens to me." Patient given ativan, haldol, and benadryl IM PRN with no hold required. Patient began to tap head on wall once again after receiving injections which then became harder taps as his anger/hopelessness increased. Patient continued to state "I have nothing left to live for." Charge nurse contacted and patient ambulated to secluded padded room with door left open. Patient went to the bathroom then back into seclusion room stating he didn't want to go into his room or lay down at the moment. Patient then lightly punched wall in padded room. Staff provided verbal de-escalation and patient eventually calmed down and is currently sitting in seclusion room with door still remaining open. Patient asked once more if he wished to go back to his room with patient replying "no." Patient sitting in seclusion room with 1:1 by side. No s/s of injuries or current distress at this time.

## 2023-08-27 NOTE — Progress Notes (Addendum)
1447: Patient found in room tearful and with blanket wrapped around neck stating he was trying to choke himself but could not fully go through with it as it scared him. Blanket removed, patient provided with emotional support and received hydroxyzine po prn. Patient stating he had nothing to live for.."my familly trespassed me." Patient appeared calmer but observed grabbing blankets once again on floor and was unable to contract for safety stating he wanted "escapism." Patient redirected. Provider notified and patient placed on 1:1. No injuries or s/s of current distress noted.

## 2023-08-27 NOTE — Progress Notes (Signed)
Patient ID: Edward [name], male   DOB: 2000-11-18, 22 y.o.   MRN: [id number]     1:1 Note  Pt awoke calm and ambulated to his room and ate dinner. Pt is currently lying in bed asleep with even and unlabored respirations. No distress or discomfort noted. 1:1 continues for safety of pt. Pt remains safe on the unit.
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
## 2023-08-27 NOTE — Group Note (Signed)
Date:  08/27/2023 Time:  9:24 PM  Group Topic/Focus:  Wrap-Up Group:   The focus of this group is to help patients review their daily goal of treatment and discuss progress on daily workbooks.    Participation Level:  Did Not Attend  Participation Quality:  Resistant  Affect:  Resistant  Cognitive:  Lacking  Insight: None  Engagement in Group:  None  Modes of Intervention:  Discussion  Additional Comments:  Customer is on a 1:1  no interest in group  Alfonse Ras 08/27/2023, 9:24 PM

## 2023-08-27 NOTE — BH IP Treatment Plan (Signed)
Interdisciplinary Treatment and Diagnostic Plan Update  08/27/2023 Time of Session: 11:05 AM Edward [name] MRN: [id number]  Principal Diagnosis: Bipolar I disorder, current or most recent episode manic, severe with mixed features (HCC)  Secondary Diagnoses: Principal Problem:   Bipolar I disorder, current or most recent episode manic, severe with mixed features (HCC)   Current Medications:  Current Facility-Administered Medications  Medication Dose Route Frequency Provider Last Rate Last Admin   acetaminophen (TYLENOL) tablet 650 mg  650 mg Oral Q6H PRN Chales Abrahams, NP       alum & mag hydroxide-simeth (MAALOX/MYLANTA) 200-200-20 MG/5ML suspension 30 mL  30 mL Oral Q4H PRN Chales Abrahams, NP       [START ON 08/28/2023] ARIPiprazole (ABILIFY) tablet 10 mg  10 mg Oral Daily Margaretmary Dys, MD       haloperidol lactate (HALDOL) injection 5 mg  5 mg Intramuscular TID PRN Chales Abrahams, NP       And   diphenhydrAMINE (BENADRYL) injection 50 mg  50 mg Intramuscular TID PRN Chales Abrahams, NP       And   LORazepam (ATIVAN) injection 2 mg  2 mg Intramuscular TID PRN Chales Abrahams, NP       hydrOXYzine (ATARAX) tablet 25 mg  25 mg Oral TID PRN Onuoha, Chinwendu V, NP   25 mg at 08/27/23 1516   magnesium hydroxide (MILK OF MAGNESIA) suspension 30 mL  30 mL Oral Daily PRN Chales Abrahams, NP       ondansetron (ZOFRAN) tablet 4 mg  4 mg Oral Q8H PRN Chales Abrahams, NP       traZODone (DESYREL) tablet 50 mg  50 mg Oral QHS PRN Onuoha, Chinwendu V, NP   50 mg at 08/26/23 2129   PTA Medications: Medications Prior to Admission  Medication Sig Dispense Refill Last Dose/Taking   ARIPiprazole (ABILIFY) 5 MG tablet Take 1 tablet (5 mg total) by mouth daily. (Patient not taking: Reported on 08/26/2023) 90 tablet 1    FLUoxetine (PROZAC) 20 MG capsule Take 1 capsule (20 mg total) by mouth every morning. (Patient not taking: Reported on 08/26/2023) 90 capsule 1     Patient  Stressors:    Patient Strengths: Ability for insight  Communication skills   Treatment Modalities: Medication Management, Group therapy, Case management,  1 to 1 session with clinician, Psychoeducation, Recreational therapy.   Physician Treatment Plan for Primary Diagnosis: Bipolar I disorder, current or most recent episode manic, severe with mixed features (HCC) Long Term Goal(s):     Short Term Goals:    Medication Management: Evaluate patient's response, side effects, and tolerance of medication regimen.  Therapeutic Interventions: 1 to 1 sessions, Unit Group sessions and Medication administration.  Evaluation of Outcomes: Not Progressing  Physician Treatment Plan for Secondary Diagnosis: Principal Problem:   Bipolar I disorder, current or most recent episode manic, severe with mixed features (HCC)  Long Term Goal(s):     Short Term Goals:       Medication Management: Evaluate patient's response, side effects, and tolerance of medication regimen.  Therapeutic Interventions: 1 to 1 sessions, Unit Group sessions and Medication administration.  Evaluation of Outcomes: Not Progressing   RN Treatment Plan for Primary Diagnosis: Bipolar I disorder, current or most recent episode manic, severe with mixed features (HCC) Long Term Goal(s): Knowledge of disease and therapeutic regimen to maintain health will improve  Short Term Goals: Ability to remain free from injury will improve, Ability  to verbalize frustration and anger appropriately will improve, Ability to demonstrate self-control, Ability to participate in decision making will improve, Ability to verbalize feelings will improve, Ability to disclose and discuss suicidal ideas, Ability to identify and develop effective coping behaviors will improve, and Compliance with prescribed medications will improve  Medication Management: RN will administer medications as ordered by provider, will assess and evaluate patient's response and  provide education to patient for prescribed medication. RN will report any adverse and/or side effects to prescribing provider.  Therapeutic Interventions: 1 on 1 counseling sessions, Psychoeducation, Medication administration, Evaluate responses to treatment, Monitor vital signs and CBGs as ordered, Perform/monitor CIWA, COWS, AIMS and Fall Risk screenings as ordered, Perform wound care treatments as ordered.  Evaluation of Outcomes: Not Progressing   LCSW Treatment Plan for Primary Diagnosis: Bipolar I disorder, current or most recent episode manic, severe with mixed features (HCC) Long Term Goal(s): Safe transition to appropriate next level of care at discharge, Engage patient in therapeutic group addressing interpersonal concerns.  Short Term Goals: Engage patient in aftercare planning with referrals and resources, Increase social support, Increase ability to appropriately verbalize feelings, Increase emotional regulation, Facilitate acceptance of mental health diagnosis and concerns, Facilitate patient progression through stages of change regarding substance use diagnoses and concerns, Identify triggers associated with mental health/substance abuse issues, and Increase skills for wellness and recovery  Therapeutic Interventions: Assess for all discharge needs, 1 to 1 time with Social worker, Explore available resources and support systems, Assess for adequacy in community support network, Educate family and significant other(s) on suicide prevention, Complete Psychosocial Assessment, Interpersonal group therapy.  Evaluation of Outcomes: Not Progressing   Progress in Treatment: Attending groups:  patient recently admitted Participating in groups: patient recently admitted Taking medication as prescribed: No. Toleration medication: No. Family/Significant other contact made: consents pending Patient understands diagnosis: Yes. Discussing patient identified problems/goals with staff:  Yes. Medical problems stabilized or resolved: Yes. Denies suicidal/homicidal ideation: Yes. Issues/concerns per patient self-inventory: No.  New problem(s) identified: No  New Short Term/Long Term Goal(s):   medication stabilization, elimination of SI thoughts, development of comprehensive mental wellness plan.    Patient Goals:  "I had an outburst because my old sister was picking on me.  I need roof over my head.  I don't have motivation."  When asked if he would be interested in medication management and therapy, he responded, "I'm not sure if it would help me a lot" but was open to receiving medications in the hospital.  Discharge Plan or Barriers:  Patient recently admitted. CSW will continue to follow and assess for appropriate referrals and possible discharge planning.    Reason for Continuation of Hospitalization: Suicidal ideation  Estimated Length of Stay:  5 - 7 days  Last 3 [place] Suicide Severity Risk Score: Flowsheet Row [facility] (Current) [facility] Most recent reading at 08/26/2023  6:53 PM ED from 08/26/2023 in [facility] Emergency Department at [facility] Most recent reading at 08/26/2023 12:55 AM ED from 08/13/2023 in [facility] Emergency Department at [facility] Most recent reading at 08/13/2023 11:13 AM  C-SSRS RISK CATEGORY No Risk No Risk No Risk       Last PHQ 2/9 Scores:    06/10/2023    2:32 PM 03/04/2023    2:42 PM  Depression screen PHQ 2/9  Decreased Interest 1 1  Down, Depressed, Hopeless 2 2  PHQ - 2 Score 3 3  Altered  sleeping 1 1  Tired, decreased energy 1 1  Change in appetite 1 1  Feeling bad or failure about yourself  3 3  Trouble concentrating 3 3  Moving slowly or fidgety/restless 0 3  Suicidal thoughts 0 0  PHQ-9 Score 12 15  Difficult doing work/chores Somewhat difficult Extremely dIfficult    Scribe for Treatment Team: Alla Feeling,  LCSW-A 08/27/2023 3:34 PM
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
## 2023-08-27 NOTE — Progress Notes (Signed)
   08/26/23 2100  Psych Admission Type (Psych Patients Only)  Admission Status Involuntary  Psychosocial Assessment  Patient Complaints Hopelessness  Eye Contact Brief  Facial Expression Sad  Affect Depressed;Blunted  Speech Logical/coherent;Slow  Interaction Avoidant  Motor Activity Slow  Appearance/Hygiene Disheveled  Behavior Characteristics Anxious  Mood Depressed  Thought Process  Coherency WDL  Content Blaming others  Delusions WDL;None reported or observed  Perception WDL  Hallucination None reported or observed  Judgment Impaired  Confusion WDL  Danger to Self  Current suicidal ideation? Denies  Danger to Others  Danger to Others None reported or observed

## 2023-08-28 DIAGNOSIS — F3113 Bipolar disorder, current episode manic without psychotic features, severe: Secondary | ICD-10-CM | POA: Diagnosis not present

## 2023-08-28 MED ORDER — SERTRALINE HCL 25 MG PO TABS
25.0000 mg | ORAL_TABLET | Freq: Every day | ORAL | Status: DC
Start: 2023-08-28 — End: 2023-08-29
  Administered 2023-08-28 – 2023-08-29 (×2): 25 mg via ORAL
  Filled 2023-08-28 (×3): qty 1

## 2023-08-28 NOTE — Plan of Care (Signed)
  Problem: Education: Goal: Knowledge of [facility] Education information/materials will improve Outcome: Progressing Goal: Emotional status will improve Outcome: Progressing Goal: Mental status will improve Outcome: Progressing Goal: Verbalization of understanding the information provided will improve Outcome: Progressing   Problem: Activity: Goal: Interest or engagement in activities will improve Outcome: Progressing Goal: Sleeping patterns will improve Outcome: Progressing   Problem: Coping: Goal: Ability to verbalize frustrations and anger appropriately will improve Outcome: Progressing Goal: Ability to demonstrate self-control will improve Outcome: Progressing   Problem: Health Behavior/Discharge Planning: Goal: Identification of resources available to assist in meeting health care needs will improve Outcome: Progressing Goal: Compliance with treatment plan for underlying cause of condition will improve Outcome: Progressing   Problem: Physical Regulation: Goal: Ability to maintain clinical measurements within normal limits will improve Outcome: Progressing   Problem: Safety: Goal: Periods of time without injury will increase Outcome: Progressing   Problem: Education: Goal: Knowledge of [facility] Education information/materials will improve Outcome: Progressing Goal: Emotional status will improve Outcome: Progressing Goal: Mental status will improve Outcome: Progressing Goal: Verbalization of understanding the information provided will improve Outcome: Progressing   Problem: Activity: Goal: Interest or engagement in activities will improve Outcome: Progressing Goal: Sleeping patterns will improve Outcome: Progressing   Problem: Coping: Goal: Ability to verbalize frustrations and anger appropriately will improve Outcome: Progressing Goal: Ability to demonstrate self-control will improve Outcome: Progressing   Problem: Health  Behavior/Discharge Planning: Goal: Identification of resources available to assist in meeting health care needs will improve Outcome: Progressing Goal: Compliance with treatment plan for underlying cause of condition will improve Outcome: Progressing   Problem: Physical Regulation: Goal: Ability to maintain clinical measurements within normal limits will improve Outcome: Progressing   Problem: Safety: Goal: Periods of time without injury will increase Outcome: Progressing

## 2023-08-28 NOTE — Progress Notes (Signed)
Pt remains on 1:! At this time. Pt in bed. Pt presents with flat affect. Pt denies SI/HI/AVH. Pt medication compliant.
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
## 2023-08-28 NOTE — Progress Notes (Signed)
Nursing 1:1 note D:Patient observed sleeping in bed with eyes closed. RR even and unlabored. No distress noted.   A: 1:1 observation continues for safety. R: Patient remains safe.

## 2023-08-28 NOTE — Progress Notes (Signed)
   08/27/23 2200  Psych Admission Type (Psych Patients Only)  Admission Status Involuntary  Psychosocial Assessment  Patient Complaints Hopelessness;Worthlessness  Eye Contact Brief  Facial Expression Sad  Affect Depressed  Speech Logical/coherent  Interaction Minimal;No initiation  Motor Activity Slow  Appearance/Hygiene Disheveled  Behavior Characteristics Unwilling to participate;Calm  Mood Depressed  Thought Process  Coherency Unable to assess  Content UTA  Delusions UTA  Perception UTA  Hallucination None reported or observed  Judgment Impaired  Confusion UTA  Danger to Self  Current suicidal ideation?  (Would not answer assessment questions.)  Self-Injurious Behavior No self-injurious ideation or behavior indicators observed or expressed   Agreement Not to Harm Self  (Would not answer assessment questions.)  Danger to Others  Danger to Others None reported or observed

## 2023-08-28 NOTE — Plan of Care (Signed)
  Problem: Education: Goal: Mental status will improve Outcome: Progressing   Problem: Activity: Goal: Sleeping patterns will improve Outcome: Progressing   Problem: Safety: Goal: Periods of time without injury will increase Outcome: Progressing   Problem: Education: Goal: Emotional status will improve Outcome: Not Progressing Goal: Verbalization of understanding the information provided will improve Outcome: Not Progressing   Problem: Activity: Goal: Interest or engagement in activities will improve Outcome: Not Progressing   Problem: Coping: Goal: Ability to verbalize frustrations and anger appropriately will improve Outcome: Not Progressing Goal: Ability to demonstrate self-control will improve Outcome: Not Progressing

## 2023-08-28 NOTE — BHH Group Notes (Signed)
Pt did not attend wrap-up group   

## 2023-08-28 NOTE — Progress Notes (Signed)
At start of shift patient was observed lying in bed with head covered. Arouses to verbal stimulation, but would not answer shift assessment questions.Patient was compliant with medication. Patient has isolated to room/ bed all shift, appeared to be asleep most of the time but restless movement noted through the night. No apparent signs of distress noted, patient has been on 1:1 all shift, no concerns noted or reported. Will continue to monitor and continue with plan of care.

## 2023-08-28 NOTE — Progress Notes (Signed)
Pt received hydroxyzine prn this afternoon after he began pacing and bumping himself into walls, picking pieces of signs off the walls and noncompliant with redirection. Pt did calm after that and went to cafeteria for dinner without incident.
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
## 2023-08-28 NOTE — Progress Notes (Signed)
The patient is lying is his bed but is awake. The patient continues to move around in bed frequently.

## 2023-08-28 NOTE — Group Note (Signed)
Recreation Therapy Group Note   Group Topic:Relaxation  Group Date: 08/28/2023 Start Time: 0950 End Time: 1100 Facilitators: Phinneas Shakoor-McCall, LRT,CTRS Location: [address] Hall Dayroom   Group Topic: Relaxation  Goal Area(s) Addresses:  Patient will identify positive relaxation techniques. Patient will identify benefits of using relaxation techniques post d/c.  Group Description: LRT and patients discussed how music can be used as a relaxation technique. Patients were allowed to pick songs they deemed calming and meaningful to them. Patients had to pick songs that were clean and appropriate during session.  Education:  Relaxation, Discharge Planning.   Education Outcome:  Acknowledges Education   Affect/Mood: N/A   Participation Level: Did not attend    Clinical Observations/Individualized Feedback:     Plan: Continue to engage patient in RT group sessions 2-3x/week.   Heriberto Stmartin-McCall, LRT,CTRS  08/28/2023 11:45 AM

## 2023-08-28 NOTE — Progress Notes (Signed)
Nursing 1:1 note D:Patient observed sleeping in bed with eyes closed. RR even and unlabored. No distress noted. Patient used the bathroom at 2300  A: 1:1 observation continues for safety  R: Patient remains safe

## 2023-08-29 ENCOUNTER — Encounter (HOSPITAL_COMMUNITY): Payer: Self-pay | Admitting: Adult Health

## 2023-08-29 DIAGNOSIS — F3113 Bipolar disorder, current episode manic without psychotic features, severe: Secondary | ICD-10-CM

## 2023-08-29 LAB — VITAMIN B12: Vitamin B-12: 400 pg/mL (ref 180–914)

## 2023-08-29 LAB — LIPID PANEL
Cholesterol: 157 mg/dL (ref 0–200)
HDL: 46 mg/dL (ref >40)
LDL Cholesterol: 70 mg/dL (ref 0–99)
Total CHOL/HDL Ratio: 3.4 {ratio}
Triglycerides: 203 mg/dL — ABNORMAL HIGH (ref <150)
VLDL: 41 mg/dL — ABNORMAL HIGH (ref 0–40)

## 2023-08-29 LAB — VITAMIN D 25 HYDROXY (VIT D DEFICIENCY, FRACTURES): Vit D, 25-Hydroxy: 29.51 ng/mL — ABNORMAL LOW (ref 30–100)

## 2023-08-29 LAB — FOLATE: Folate: 12.9 ng/mL (ref >5.9)

## 2023-08-29 MED ORDER — ARIPIPRAZOLE 15 MG PO TABS
15.0000 mg | ORAL_TABLET | Freq: Every day | ORAL | Status: DC
  Administered 2023-08-30 – 2023-09-01 (×3): 15 mg via ORAL
  Filled 2023-08-29: qty 10
  Filled 2023-08-29: qty 1
  Filled 2023-08-29: qty 10
  Filled 2023-08-29 (×4): qty 1

## 2023-08-29 MED ORDER — SERTRALINE HCL 50 MG PO TABS
50.0000 mg | ORAL_TABLET | Freq: Every day | ORAL | Status: DC
  Administered 2023-08-30 – 2023-09-01 (×3): 50 mg via ORAL
  Filled 2023-08-29 (×4): qty 1
  Filled 2023-08-29: qty 10
  Filled 2023-08-29: qty 1

## 2023-08-29 NOTE — Progress Notes (Signed)
[facility] Post 1:1 Observation Documentation  For the first (8) hours following discontinuation of 1:1 precautions, a progress note entry by nursing staff should be documented at least every 2 hours, reflecting the patient's behavior, condition, mood, and conversation.  Use the progress notes for additional entries.  Time 1:1 discontinued: 1128   Patient's Behavior:  calm, cooperative with care.   Patient's Condition:  Appears to be in no physical distress.  Patient's Conversation:  "I'm alright".  Sherryl Manges 08/29/2023, 1328
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
## 2023-08-29 NOTE — Progress Notes (Signed)
[facility] Post 1:1 Observation Documentation  For the first (8) hours following discontinuation of 1:1 precautions, a progress note entry by nursing staff should be documented at least every 2 hours, reflecting the patient's behavior, condition, mood, and conversation.  Use the progress notes for additional entries.  Time 1:1 discontinued:  1128  Patient's Behavior:  calm in room  Patient's Condition: Pt appears to be in no physical distress. Cooperative with care at this time.    Patient's Conversation:  "Am I off the 1:1 thing now?". Process explained to pt about 1:1 monitoring being stopped. Pt verbalized understanding. Q 15 minutes safety checks initiated.   Sherryl Manges 08/29/2023, 1128

## 2023-08-29 NOTE — Progress Notes (Signed)
Pt A & O X3. Denies SI, HI, AVH and pain when assessed. Visible at medication window on initial contact. Noted with flat affect, fair eye contact, logical speech with depressed mood. Rates his anxiety 7/10 and depression 5/10 with current stressor being placement issues. Per pt "I just don't know where I'm going after I leave here, that's stressing me out". Visible in milieu for scheduled group. Compliant with medications when offered, denies adverse drug reactions. Safety maintained on 1:1 observation with assigned staff in attendance at all times. Pt's concerns validated, encouraged to speech with CSW related to placement issue. Emotional support, encouragement and reassurance offered to pt.

## 2023-08-29 NOTE — Plan of Care (Signed)
  Problem: Education: Goal: Knowledge of [facility] Education information/materials will improve Outcome: Progressing   Problem: Health Behavior/Discharge Planning: Goal: Compliance with treatment plan for underlying cause of condition will improve Outcome: Progressing   Problem: Physical Regulation: Goal: Ability to maintain clinical measurements within normal limits will improve Outcome: Progressing

## 2023-08-29 NOTE — Group Note (Signed)
Date:  08/29/2023 Time:  8:45 PM  Group Topic/Focus:  Wrap-Up Group:   The focus of this group is to help patients review their daily goal of treatment and discuss progress on daily workbooks.    Participation Level:  Did Not Attend  Scot Dock 08/29/2023, 8:45 PM

## 2023-08-29 NOTE — Progress Notes (Signed)
[facility] Post 1:1 Observation Documentation  For the first (8) hours following discontinuation of 1:1 precautions, a progress note entry by nursing staff should be documented at least every 2 hours, reflecting the patient's behavior, condition, mood, and conversation.  Use the progress notes for additional entries.  Time 1:1 discontinued:  1128  Patient's Behavior:  Guarded, cautious on interactions.  Patient's Condition:  Anxious, worried but is cooperative with care at this time.   Patient's Conversation: "I'm just worried about what my sister will do to my stuff at home".   Sherryl Manges 08/29/2023,1528

## 2023-08-29 NOTE — Group Note (Signed)
Recreation Therapy Group Note   Group Topic:Communication  Group Date: 08/29/2023 Start Time: 1010 End Time: 1040 Facilitators: Jeffery Bachmeier-McCall, LRT,CTRS Location: [address] Dayroom   Group Topic: Communication, Problem Solving   Goal Area(s) Addresses:  Patient will effectively listen to complete activity.  Patient will identify communication skills used to make activity successful.  Patient will identify how skills used during activity can be used to reach post d/c goals.    Intervention: Building surveyor Activity - Geometric pattern cards, pencils, blank paper    Group Description: Geometric Drawings.  Three volunteers from the peer group will be shown an abstract picture with a particular arrangement of geometrical shapes.  Each round, one 'speaker' will describe the pattern, as accurately as possible without revealing the image to the group.  The remaining group members will listen and draw the picture to reflect how it is described to them. Patients with the role of 'listener' cannot ask clarifying questions but, may request that the speaker repeat a direction. Once the drawings are complete, the presenter will show the rest of the group the picture and compare how close each person came to drawing the picture. LRT will facilitate a post-activity discussion regarding effective communication and the importance of planning, listening, and asking for clarification in daily interactions with others.   Education: Environmental consultant, Active listening, Support systems, Discharge planning  Education Outcome:  Acknowledges understanding/In group clarification offered/Needs additional education.     Affect/Mood: Depressed   Participation Level: None   Participation Quality: Independent   Behavior: Withdrawn   Speech/Thought Process: Barely audible    Insight: Poor   Judgement: Poor   Modes of Intervention: Activity   Patient Response to Interventions:  Avoidant    Education Outcome:  In group clarification offered    Clinical Observations/Individualized Feedback: Pt was quiet and showed little interest in activity. Pt identified text and words as ways of communicating. Pt drew one shape on his paper before not participating farther. Pt was called out of group to meet with doctor but returned. Pt eventually left again and didn't return.      Plan: Continue to engage patient in RT group sessions 2-3x/week.   Chani Ghanem-McCall, LRT,CTRS 08/29/2023 11:47 AM

## 2023-08-29 NOTE — Plan of Care (Signed)
  Problem: Education: Goal: Knowledge of [facility] Education information/materials will improve Outcome: Progressing Goal: Emotional status will improve Outcome: Progressing Goal: Mental status will improve Outcome: Progressing Goal: Verbalization of understanding the information provided will improve Outcome: Progressing  1:1 Observation ongoing Patient in bed sleeping respirations noted. No S/S of distress no self injurious behavior. Support ongoing.

## 2023-08-29 NOTE — Progress Notes (Signed)
  1:1 NOTE  Patient in room isolative required encouragement to come out of his room, stated he did not have a good day today denies SI/HI/A/VH and verbally contracts for safety. Compliant with medications endorses sleep distubance and anxieyt. PRN Vistaril and Trazodone given at 2230 and reported effective. 1;1 Observation by bed side. Support and encouragement ongoing. Patient remains safe.
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
## 2023-08-30 ENCOUNTER — Encounter (HOSPITAL_COMMUNITY): Payer: Self-pay | Admitting: Adult Health

## 2023-08-30 DIAGNOSIS — E559 Vitamin D deficiency, unspecified: Secondary | ICD-10-CM

## 2023-08-30 HISTORY — DX: Vitamin D deficiency, unspecified: E55.9

## 2023-08-30 LAB — RPR: RPR Ser Ql: NONREACTIVE

## 2023-08-30 MED ORDER — VITAMIN D3 25 MCG PO TABS
2000.0000 [IU] | ORAL_TABLET | Freq: Every day | ORAL | Status: DC
  Administered 2023-08-30 – 2023-09-01 (×3): 2000 [IU] via ORAL
  Filled 2023-08-30: qty 2
  Filled 2023-08-30: qty 20
  Filled 2023-08-30 (×3): qty 2

## 2023-08-30 NOTE — Plan of Care (Signed)
  Problem: Coping: Goal: Ability to demonstrate self-control will improve Outcome: Progressing   Problem: Safety: Goal: Periods of time without injury will increase Outcome: Progressing   Problem: Coping: Goal: Ability to verbalize frustrations and anger appropriately will improve Outcome: Progressing
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
## 2023-08-30 NOTE — Group Note (Signed)
Date:  08/30/2023 Time:  8:52 PM  Group Topic/Focus:  Wrap-Up Group:   The focus of this group is to help patients review their daily goal of treatment and discuss progress on daily workbooks.    Participation Level:  Did Not Attend  Participation Quality:   N/A  Affect:   N/A  Cognitive:   N/A  Insight: None  Engagement in Group:   N/A  Modes of Intervention:   N/A  Additional Comments:  Patient did not attend group.   Kennieth Francois 08/30/2023, 8:52 PM

## 2023-08-30 NOTE — Progress Notes (Signed)
   08/30/23 1000  Psych Admission Type (Psych Patients Only)  Admission Status Involuntary  Psychosocial Assessment  Patient Complaints Isolation  Eye Contact Brief  Facial Expression Flat  Affect Depressed  Speech Logical/coherent  Interaction Minimal;Isolative  Motor Activity Other (Comment) (WNL)  Appearance/Hygiene Disheveled  Behavior Characteristics Cooperative  Mood Depressed  Thought Process  Coherency WDL  Content WDL  Delusions None reported or observed  Perception WDL  Hallucination None reported or observed  Judgment Impaired  Confusion None  Danger to Self  Current suicidal ideation? Denies  Self-Injurious Behavior No self-injurious ideation or behavior indicators observed or expressed   Agreement Not to Harm Self No  Description of Agreement verbal  Danger to Others  Danger to Others None reported or observed

## 2023-08-30 NOTE — Plan of Care (Signed)
°  Problem: Education: °Goal: Emotional status will improve °Outcome: Progressing °Goal: Mental status will improve °Outcome: Progressing °Goal: Verbalization of understanding the information provided will improve °Outcome: Progressing °  °

## 2023-08-30 NOTE — BHH Counselor (Signed)
Patient informed CSW that he spoke with his mother today, who confirmed that he will be unable to return to her residence at discharge. Patient has left a message with his girlfriend to inquire about housing. He agreed to f/up with CSW if in need of emergency housing options.   CSW made an attempt to complete SPE with mother at 2:29 PM, LM for a return call.
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
## 2023-08-30 NOTE — Progress Notes (Signed)
   08/29/23 2000  Psych Admission Type (Psych Patients Only)  Admission Status Involuntary  Psychosocial Assessment  Patient Complaints Isolation;Anxiety  Eye Contact Brief  Facial Expression Sad  Affect Depressed  Speech Logical/coherent  Interaction Minimal  Motor Activity Other (Comment)  Appearance/Hygiene Disheveled  Behavior Characteristics Cooperative  Mood Depressed;Anxious  Thought Process  Coherency WDL  Content WDL  Delusions None reported or observed  Perception WDL  Hallucination None reported or observed  Judgment Impaired  Confusion None  Danger to Self  Current suicidal ideation? Denies  Self-Injurious Behavior No self-injurious ideation or behavior indicators observed or expressed   Agreement Not to Harm Self Yes  Description of Agreement verbal  Danger to Others  Danger to Others None reported or observed

## 2023-08-31 NOTE — Group Note (Signed)
LCSW Group Therapy Note  Group Date: 08/31/2023 Start Time: 1000 End Time: 1100   Type of Therapy and Topic:  Group Therapy - How To Cope with Nervousness about Discharge   Participation Level:  Did Not Attend   Description of Group This process group involved identification of patients' feelings about discharge. Some of them are scheduled to be discharged soon, while others are new admissions, but each of them was asked to share thoughts and feelings surrounding discharge from the hospital. One common theme was that they are excited at the prospect of going home, while another was that many of them are apprehensive about sharing why they were hospitalized. Patients were given the opportunity to discuss these feelings with their peers in preparation for discharge.  Therapeutic Goals  Patient will identify their overall feelings about pending discharge. Patient will think about how they might proactively address issues that they believe will once again arise once they get home (i.e. with parents). Patients will participate in discussion about having hope for change.   Summary of Patient Progress:  Did not attend   Therapeutic Modalities Cognitive Behavioral Therapy   Beatris Si, LCSW 08/31/2023  10:56 AM

## 2023-08-31 NOTE — BHH Group Notes (Signed)
Psychoeducational Group Note  Date:  08/31/2023 Time:  800p  Group Topic/Focus:  Wrap-Up Group:   The focus of this group is to help patients review their daily goal of treatment and discuss progress on daily workbooks.  Participation Level: Did Not Attend  Participation Quality:  Not Applicable  Affect:  Not Applicable  Cognitive:  Not Applicable  Insight:  Not Applicable  Engagement in Group: Not Applicable  Additional Comments:  Pt refused to attend group.  Edward [name], [name] 08/31/2023, 9:37 PM

## 2023-08-31 NOTE — Progress Notes (Signed)
°   08/30/23 2008  Psych Admission Type (Psych Patients Only)  Admission Status Involuntary  Psychosocial Assessment  Patient Complaints Isolation  Eye Contact Brief  Facial Expression Flat  Affect Depressed;Anxious  Speech Logical/coherent  Interaction Minimal;Isolative  Motor Activity Other (Comment) (WDL)  Appearance/Hygiene Disheveled  Behavior Characteristics Cooperative;Appropriate to situation  Mood Depressed;Anxious  Thought Process  Coherency WDL  Content WDL  Delusions None reported or observed  Perception WDL  Hallucination None reported or observed  Judgment Impaired  Confusion None  Danger to Self  Current suicidal ideation? Denies  Self-Injurious Behavior No self-injurious ideation or behavior indicators observed or expressed   Description of Agreement verbal  Danger to Others  Danger to Others None reported or observed

## 2023-08-31 NOTE — Plan of Care (Signed)
  Problem: Activity: Goal: Sleeping patterns will improve Outcome: Progressing   Problem: Coping: Goal: Ability to verbalize frustrations and anger appropriately will improve Outcome: Progressing Goal: Ability to demonstrate self-control will improve Outcome: Progressing   Problem: Safety: Goal: Periods of time without injury will increase Outcome: Progressing

## 2023-08-31 NOTE — Group Note (Signed)
Date:  08/31/2023 Time:  1:09 PM  Group Topic/Focus:  Conflict Resolution:   The focus of this group is to discuss the conflict resolution process and how it may be used upon discharge. Goals Group:   The focus of this group is to help patients establish daily goals to achieve during treatment and discuss how the patient can incorporate goal setting into their daily lives to aide in recovery. Orientation:   The focus of this group is to educate the patient on the purpose and policies of crisis stabilization and provide a format to answer questions about their admission.  The group details unit policies and expectations of patients while admitted.    Participation Level:  Did Not Attend   Arnoldo Hooker 08/31/2023, 1:09 PM

## 2023-08-31 NOTE — Progress Notes (Signed)
Patient rated his anxiety lev 4/10 and his depression level 4/10 with 10 being the highest and 0 none. Pt identified his goal for today as, " Stay positive". Medication compliant. Pt declined to attend group therapy up to this time on shift. Minimal interaction observed with peers. Safety maintained.  08/31/23 0830  Psych Admission Type (Psych Patients Only)  Admission Status Involuntary  Psychosocial Assessment  Patient Complaints Isolation  Eye Contact Brief  Facial Expression Flat  Affect Anxious;Depressed  Speech Logical/coherent  Interaction Minimal;Isolative  Motor Activity Other (Comment) (wnl)  Appearance/Hygiene Disheveled  Behavior Characteristics Cooperative  Mood Depressed;Anxious  Thought Process  Coherency WDL  Content WDL  Delusions None reported or observed  Perception WDL  Hallucination None reported or observed  Judgment Impaired  Confusion None  Danger to Self  Current suicidal ideation? Denies  Self-Injurious Behavior No self-injurious ideation or behavior indicators observed or expressed   Agreement Not to Harm Self Yes  Description of Agreement Verbal  Danger to Others  Danger to Others None reported or observed
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
## 2023-09-01 ENCOUNTER — Other Ambulatory Visit (HOSPITAL_COMMUNITY): Payer: Self-pay

## 2023-09-01 ENCOUNTER — Telehealth (HOSPITAL_COMMUNITY): Payer: Self-pay | Admitting: Pharmacy Technician

## 2023-09-01 DIAGNOSIS — F3113 Bipolar disorder, current episode manic without psychotic features, severe: Secondary | ICD-10-CM | POA: Diagnosis not present

## 2023-09-01 LAB — HEMOGLOBIN A1C
Hgb A1c MFr Bld: 5.5 % (ref 4.8–5.6)
Mean Plasma Glucose: 111 mg/dL

## 2023-09-01 MED ORDER — TRAZODONE HCL 50 MG PO TABS: 50.0000 mg | ORAL_TABLET | Freq: Every evening | ORAL | 0 refills | Status: DC | PRN

## 2023-09-01 MED ORDER — SERTRALINE HCL 50 MG PO TABS: 50.0000 mg | ORAL_TABLET | Freq: Every day | ORAL | 0 refills | Status: DC

## 2023-09-01 MED ORDER — VITAMIN D3 25 MCG PO TABS: 2000.0000 [IU] | ORAL_TABLET | Freq: Every day | ORAL | 0 refills | Status: DC

## 2023-09-01 MED ORDER — HYDROXYZINE HCL 25 MG PO TABS: 25.0000 mg | ORAL_TABLET | Freq: Three times a day (TID) | ORAL | 0 refills | Status: DC | PRN

## 2023-09-01 MED ORDER — ARIPIPRAZOLE 15 MG PO TABS: 15.0000 mg | ORAL_TABLET | Freq: Every day | ORAL | 0 refills | Status: DC

## 2023-09-01 NOTE — [facility] Suicide Risk Assessment (Signed)
Suicide Risk Assessment  Discharge Assessment    [facility] Discharge Suicide Risk Assessment   Principal Problem: Bipolar I disorder, current or most recent episode manic, severe with mixed features [facility]) Discharge Diagnoses: Principal Problem:   Bipolar I disorder, current or most recent episode manic, severe with mixed features (HCC)   Total Time spent with patient: 30 minutes  Edward [name] is a 22 y.o. male who has a past medical history of ADHD, Bipolar 1 disorder (HCC), Intellectual developmental disorder, mild, ODD (oppositional defiant disorder), and Vitamin D insufficiency (08/30/2023). He presented from an outside hospital for Adjustment disorder with mixed anxiety and depressed mood [F43.23].  He was reportedly brought in by Patent examiner as he was believed to be dangerous to himself having been found lying in the street.  He reports suicidal ideation related to recent homelessness after mother forbade him from returning to the home.    During the patient's hospitalization, patient had extensive initial psychiatric evaluation, and follow-up psychiatric evaluations every day.  Psychiatric diagnoses provided upon initial assessment:  Bipolar I disorder, current or most recent episode manic, severe with mixed features (HCC)   Patient's psychiatric medications were adjusted on admission:  Increased home Abilify to 10mg  Discontinued home Prozac 20mg  Started Trazodone 50mg  qhs  During the hospitalization, other adjustments were made to the patient's psychiatric medication regimen:  Increased Abilify to 15mg  Started Zoloft and titrated up to 50mg  daily  Gradually, patient started adjusting to milieu.   Patient's care was discussed during the interdisciplinary team meeting every day during the hospitalization. Patient attempted to hang himself with a blanket on 12/25 and had to receive agitation protocols and placed on 1:1. This was discontinued 12/27 and patient was observed calm.  Patient continued to do well and was transferred off the high acuity unit were he continued to be stable. Patient involvement in group activities was limited, but he did participate in gym activities and spent some time out of his room. At time of discharge patient felt ready to go, and mom was willing to allow patient to come by to get his things, but she as not able to allow him to stay with her due to pending charges by the landlord. Patient although disappointed, was willing to look for other housing options and was able to get accepted into a shelter after making his own phone calls.  The patient denied having side effects to prescribed psychiatric medication.  The patient reports their target psychiatric symptoms of irritability, impulsivity and depressed mood responded well to the psychiatric medications, and the patient reports overall benefit other psychiatric hospitalization. Supportive psychotherapy was provided to the patient. The patient also participated in regular group therapy while admitted.   Labs were reviewed with the patient, and abnormal results were discussed with the patient.  The patient denied having suicidal thoughts more than 48 hours prior to discharge.  Patient denies having homicidal thoughts.  Patient denies having auditory hallucinations.  Patient denies any visual hallucinations.  Patient denies having paranoid thoughts.  The patient is able to verbalize their individual safety plan to this provider.  It is recommended to the patient to continue psychiatric medications as prescribed, after discharge from the hospital.    It is recommended to the patient to follow up with your outpatient psychiatric provider and PCP.  Discussed with the patient, the impact of alcohol, drugs, tobacco have been there overall psychiatric and medical wellbeing, and total abstinence from substance use was recommended the patient.  Musculoskeletal: Strength & Muscle Tone: within  normal limits Gait & Station: normal Patient leans: N/A  Psychiatric Specialty Exam  Presentation  General Appearance:  Appropriate for Environment  Eye Contact: Good  Speech: Clear and Coherent  Speech Volume: Normal  Handedness: Right   Mood and Affect  Mood: Euthymic  Duration of Depression Symptoms: No data recorded Affect: Flat   Thought Process  Thought Processes: Linear  Descriptions of Associations:Intact  Orientation:Full (Time, Place and Person)  Thought Content:Logical  History of Schizophrenia/Schizoaffective disorder:No data recorded Duration of Psychotic Symptoms:No data recorded Hallucinations:Hallucinations: None  Ideas of Reference:None  Suicidal Thoughts:Suicidal Thoughts: No  Homicidal Thoughts:Homicidal Thoughts: No   Sensorium  Memory: Immediate Fair; Recent Fair  Judgment: Fair  Insight: Shallow   Executive Functions  Concentration: Fair  Attention Span: Fair  Recall: Fair  Fund of Knowledge: Poor  Language: Fair   Psychomotor Activity  Psychomotor Activity: Psychomotor Activity: Normal   Assets  Assets: Resilience   Sleep  Sleep: Sleep: Good Number of Hours of Sleep: 8.75   Physical Exam: Physical Exam Constitutional:      Appearance: Normal appearance.  HENT:     Head: Normocephalic and atraumatic.  Pulmonary:     Effort: Pulmonary effort is normal.  Neurological:     Mental Status: He is alert and oriented to person, place, and time.    Review of Systems  Psychiatric/Behavioral:  Negative for depression, hallucinations and suicidal ideas. The patient is not nervous/anxious.    Blood pressure 112/70, pulse 67, temperature 97.9 F (36.6 C), temperature source Oral, resp. rate 18, height 5\' 11"  (1.803 m), weight 79.8 kg, SpO2 100%. Body mass index is 24.55 kg/m.  Mental Status Per Nursing Assessment::   On Admission:  NA  Demographic Factors:  Male and Adolescent or young  adult  Loss Factors: Legal issues  Historical Factors: Impulsivity  Risk Reduction Factors:   Positive social support  Continued Clinical Symptoms:  Previous Psychiatric Diagnoses and Treatments  Cognitive Features That Contribute To Risk:    IDD  Suicide Risk:  Mild:  Suicidal ideation of limited frequency, intensity, duration, and specificity.  There are no identifiable plans, no associated intent, mild dysphoria and related symptoms, good self-control (both objective and subjective assessment), few other risk factors, and identifiable protective factors, including available and accessible social support.   Follow-up Information     Services, [facility]. Go on 09/04/2023.   Why: You have a hospital follow up appointment on 09/04/23 at 10:00 am to obtain group therapy and medication management services.    The appointment will be held in person.  * Please bring photo ID and your insurance card/s with you. Contact information: [address] [place] [address] [phone]                 Plan Of Care/Follow-up recommendations:  Activity: as tolerated  Diet: heart healthy  Other: -Follow-up with your outpatient psychiatric provider -instructions on appointment date, time, and address (location) are provided to you in discharge paperwork.  -Take your psychiatric medications as prescribed at discharge - instructions are provided to you in the discharge paperwork  -Follow-up with outpatient primary care doctor and other specialists -for management of preventative medicine and chronic medical disease: NA  -Testing: Follow-up with outpatient provider for abnormal lab results: Vit D def  -If you are prescribed an atypical antipsychotic medication, we recommend that your outpatient psychiatrist follow routine screening for side effects within 3 months of discharge, including  monitoring: AIMS scale, height, weight, blood pressure, fasting lipid panel, HbA1c, and  fasting blood sugar.   -Recommend total abstinence from alcohol, tobacco, and other illicit drug use at discharge.   -If your psychiatric symptoms recur, worsen, or if you have side effects to your psychiatric medications, call your outpatient psychiatric provider, 911, [phone] or go to the nearest emergency department.  -If suicidal thoughts occur, immediately call your outpatient psychiatric provider, 911, [phone] or go to the nearest emergency department.    PGY-4 Bobbye Morton, MD 09/01/2023, 11:21 AM
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
## 2023-09-01 NOTE — Transportation (Signed)
09/01/2023  Adeeb Lafond DOB: 03/07/2001 MRN: [id number]   RIDER WAIVER AND RELEASE OF LIABILITY  For the purposes of helping with transportation needs, [facility] partners with outside transportation providers (taxi companies, [place], Catering manager.) to give [employer] patients or other approved people the choice of on-demand rides [facility]") to our buildings for non-emergency visits.  By using [employer], I, the person signing this document, on behalf of myself and/or any legal minors (in my care using the [employer]), agree:  Science writer given to me are supplied by independent, outside transportation providers who do not work for, or have any affiliation with, [employer]. [place] is not a transportation company. [facility] has no control over the quality or safety of the rides I get using [employer]. [place] has no control over whether any outside ride will happen on time or not. [facility] gives no guarantee on the reliability, quality, safety, or availability on any rides, or that no mistakes will happen. I know and accept that traveling by vehicle (car, truck, SVU, Zenaida Niece, bus, taxi, etc.) has risks of serious injuries such as disability, being paralyzed, and death. I know and agree the risk of using [employer] is mine alone, and not [employer]. Transport Services are provided "as is" and as are available. The transportation providers are in charge for all inspections and care of the vehicles used to provide these rides. I agree not to take legal action against [facility], its agents, employees, officers, directors, representatives, insurers, attorneys, assigns, successors, subsidiaries, and affiliates at any time for any reasons related directly or indirectly to using [employer]. I also agree not to take legal action against [facility] or its affiliates for any injury, death, or damage to property caused by or related to using  [employer]. I have read this Waiver and Release of Liability, and I understand the terms used in it and their legal meaning. This Waiver is freely and voluntarily given with the understanding that my right (or any legal minors) to legal action against  relating to [employer] is knowingly given up to use these services.   I attest that I read the Ride Waiver and Release of Liability to Pete Pelt, gave Mr. Maben the opportunity to ask questions and answered the questions asked (if any). I affirm that Pete Pelt then provided consent for assistance with transportation.

## 2023-09-01 NOTE — Discharge Summary (Signed)
Physician Discharge Summary Note  Patient:  Edward [name] is an 22 y.o., male MRN:  [id number] DOB:  2001/01/31 Patient phone:  [phone] (home)  Patient address:   [address] [place] [address],  Total Time spent with patient: 30 minutes  Date of Admission:  08/26/2023 Date of Discharge: 09/01/2023  Reason for Admission:  Edward [name] is a 22 y.o. male who has a past medical history of ADHD, Bipolar 1 disorder (HCC), Intellectual developmental disorder, mild, ODD (oppositional defiant disorder), and Vitamin D insufficiency (08/30/2023). He presented from an outside hospital for Adjustment disorder with mixed anxiety and depressed mood [F43.23]. He was reportedly brought in by Patent examiner as he was believed to be dangerous to himself having been found lying in the street. He reports suicidal ideation related to recent homelessness after mother forbade him from returning to the home.   Principal Problem: Bipolar I disorder, current or most recent episode manic, severe with mixed features (HCC) Discharge Diagnoses: Principal Problem:   Bipolar I disorder, current or most recent episode manic, severe with mixed features (HCC)   Past Psychiatric History:  Previous Psych Diagnoses: ODD, DMDD, ADHD, Anxiety, depression, anxiety, bipolar 1, learning disorder  Prior inpatient treatment: yes at age 61 Current/prior outpatient treatment:  Prior rehab hx: pt denies Psychotherapy hx: pt did not want to discuss.  History of suicide: pt did not want to discuss. Chart review indicates past ideation, but no prior attempts History of homicide or aggression:  Psychiatric medication history: reports abilify and prozac Psychiatric medication compliance history: noncompliant by self report Neuromodulation history: denies Current Psychiatrist: denies Current therapist: denies (recent hx indicates [facility] therapy?) Prior Violence: patient has hx for aggressive behaviors  Past  Medical History:  Past Medical History:  Diagnosis Date   ADHD    Bipolar 1 disorder (HCC)    Intellectual developmental disorder, mild    ODD (oppositional defiant disorder)    Vitamin D insufficiency 08/30/2023   History reviewed. No pertinent surgical history. Family History: History reviewed. No pertinent family history. Family Psychiatric  History: Unknown Social History:  Social History   Substance and Sexual Activity  Alcohol Use No     Social History   Substance and Sexual Activity  Drug Use No    Social History   Socioeconomic History   Marital status: Single    Spouse name: Not on file   Number of children: Not on file   Years of education: Not on file   Highest education level: Not on file  Occupational History   Not on file  Tobacco Use   Smoking status: Never   Smokeless tobacco: Never  Substance and Sexual Activity   Alcohol use: No   Drug use: No   Sexual activity: Not on file  Other Topics Concern   Not on file  Social History Narrative   Not on file   Social Drivers of Health   Financial Resource Strain: Not on file  Food Insecurity: Patient Declined (08/26/2023)   Hunger Vital Sign    Worried About Running Out of Food in the Last Year: Patient declined    Ran Out of Food in the Last Year: Patient declined  Transportation Needs: Patient Declined (08/26/2023)   PRAPARE - Administrator, Civil Service (Medical): Patient declined    Lack of Transportation (Non-Medical): Patient declined  Physical Activity: Not on file  Stress: Not on file  Social Connections: Unknown (01/15/2022)   Received from [facility], [facility]  Social Network    Social Network: Not on file   On day of discharge, Patient initially seen in his room. Patient reports that he believed he could return home to his mother because she said "see you tomorrow" on the phone. Patient reports that he has been having good conversations with his mother and knows  that she would at least like for him to be able to get his things. Patient reports that his mood is "fine" today and that he slept ok, but because he takes a lot of naps during the day he thinks this may be why he has frequent night time awakening.  Patient denied feeling very irritable and endorsed that his anxious for discharge but otherwise is doing ok. Patient denies SI, HI, and AVH. Patient reports a good appetite with BM yesterday.  Hospital Course:   During the patient's hospitalization, patient had extensive initial psychiatric evaluation, and follow-up psychiatric evaluations every day.   Psychiatric diagnoses provided upon initial assessment:  Bipolar I disorder, current or most recent episode manic, severe with mixed features (HCC)    Patient's psychiatric medications were adjusted on admission:  Increased home Abilify to 10mg  Discontinued home Prozac 20mg  Started Trazodone 50mg  qhs   During the hospitalization, other adjustments were made to the patient's psychiatric medication regimen:  Increased Abilify to 15mg  Started Zoloft and titrated up to 50mg  daily   Gradually, patient started adjusting to milieu.   Patient's care was discussed during the interdisciplinary team meeting every day during the hospitalization. Patient attempted to hang himself with a blanket on 12/25 and had to receive agitation protocols and placed on 1:1. This was discontinued 12/27 and patient was observed calm. Patient continued to do well and was transferred off the high acuity unit were he continued to be stable. Patient involvement in group activities was limited, but he did participate in gym activities and spent some time out of his room. At time of discharge patient felt ready to go, and mom was willing to allow patient to come by to get his things, but she as not able to allow him to stay with her due to pending charges by the landlord. Patient although disappointed, was willing to look for other housing  options and was able to get accepted into a shelter after making his own phone calls.   The patient denied having side effects to prescribed psychiatric medication.   The patient reports their target psychiatric symptoms of irritability, impulsivity and depressed mood responded well to the psychiatric medications, and the patient reports overall benefit other psychiatric hospitalization. Supportive psychotherapy was provided to the patient. The patient also participated in regular group therapy while admitted.    Labs were reviewed with the patient, and abnormal results were discussed with the patient.   The patient denied having suicidal thoughts more than 48 hours prior to discharge.  Patient denies having homicidal thoughts.  Patient denies having auditory hallucinations.  Patient denies any visual hallucinations.  Patient denies having paranoid thoughts.   The patient is able to verbalize their individual safety plan to this provider.   It is recommended to the patient to continue psychiatric medications as prescribed, after discharge from the hospital.     It is recommended to the patient to follow up with your outpatient psychiatric provider and PCP.   Discussed with the patient, the impact of alcohol, drugs, tobacco have been there overall psychiatric and medical wellbeing, and total abstinence from substance use was recommended the patient.  Physical Findings: AIMS:  , ,  ,  ,    CIWA:    COWS:     Musculoskeletal: Strength & Muscle Tone: within normal limits Gait & Station: normal Patient leans: N/A   Psychiatric Specialty Exam:  Presentation  General Appearance:  Appropriate for Environment  Eye Contact: Good  Speech: Clear and Coherent  Speech Volume: Normal  Handedness: Right   Mood and Affect  Mood: Euthymic  Affect: Flat   Thought Process  Thought Processes: Linear  Descriptions of Associations:Intact  Orientation:Full (Time, Place and  Person)  Thought Content:Logical  History of Schizophrenia/Schizoaffective disorder:No data recorded Duration of Psychotic Symptoms:No data recorded Hallucinations:Hallucinations: None  Ideas of Reference:None  Suicidal Thoughts:Suicidal Thoughts: No  Homicidal Thoughts:Homicidal Thoughts: No   Sensorium  Memory: Immediate Fair; Recent Fair  Judgment: Fair  Insight: Shallow   Executive Functions  Concentration: Fair  Attention Span: Fair  Recall: Fair  Fund of Knowledge: Poor  Language: Fair   Psychomotor Activity  Psychomotor Activity: Psychomotor Activity: Normal   Assets  Assets: Resilience   Sleep  Sleep: Sleep: Good Number of Hours of Sleep: 8.75    Physical Exam: Physical Exam Constitutional:      Appearance: Normal appearance.  HENT:     Head: Normocephalic and atraumatic.  Pulmonary:     Effort: Pulmonary effort is normal.  Neurological:     Mental Status: He is alert and oriented to person, place, and time.    Review of Systems  Psychiatric/Behavioral:  Negative for depression, hallucinations and suicidal ideas. The patient is not nervous/anxious and does not have insomnia.    Blood pressure 112/70, pulse 67, temperature 97.9 F (36.6 C), temperature source Oral, resp. rate 18, height 5\' 11"  (1.803 m), weight 79.8 kg, SpO2 100%. Body mass index is 24.55 kg/m.   Social History   Tobacco Use  Smoking Status Never  Smokeless Tobacco Never   Tobacco Cessation:  N/A, patient does not currently use tobacco products   Blood Alcohol level:  Lab Results  Component Value Date   ETH <10 08/26/2023   ETH <10 11/07/2019    Metabolic Disorder Labs:  Lab Results  Component Value Date   HGBA1C 5.5 08/29/2023   MPG 111 08/29/2023   MPG 105.41 07/11/2022   No results found for: "PROLACTIN" Lab Results  Component Value Date   CHOL 157 08/29/2023   TRIG 203 (H) 08/29/2023   HDL 46 08/29/2023   CHOLHDL 3.4 08/29/2023    VLDL 41 (H) 08/29/2023   LDLCALC 70 08/29/2023   LDLCALC 77 07/11/2022    See Psychiatric Specialty Exam and Suicide Risk Assessment completed by Attending Physician prior to discharge.  Discharge destination:  Other:  Shelter, confirmed by SW patient has been accepted  Is patient on multiple antipsychotic therapies at discharge:  No   Has Patient had three or more failed trials of antipsychotic monotherapy by history:  No  Recommended Plan for Multiple Antipsychotic Therapies: NA   Allergies as of 09/01/2023   No Known Allergies      Medication List     STOP taking these medications    FLUoxetine 20 MG capsule Commonly known as: PROZAC       TAKE these medications      Indication  ARIPiprazole 15 MG tablet Commonly known as: ABILIFY Take 1 tablet (15 mg total) by mouth daily. Start taking on: September 02, 2023 What changed:  medication strength how much to take  Indication: MIXED BIPOLAR AFFECTIVE DISORDER  hydrOXYzine 25 MG tablet Commonly known as: ATARAX Take 1 tablet (25 mg total) by mouth 3 (three) times daily as needed for anxiety.  Indication: Feeling Anxious, Feeling Tense   sertraline 50 MG tablet Commonly known as: ZOLOFT Take 1 tablet (50 mg total) by mouth daily. Start taking on: September 02, 2023  Indication: Major Depressive Disorder   traZODone 50 MG tablet Commonly known as: DESYREL Take 1 tablet (50 mg total) by mouth at bedtime as needed for sleep.  Indication: Trouble Sleeping   vitamin D3 25 MCG tablet Commonly known as: CHOLECALCIFEROL Take 2 tablets (2,000 Units total) by mouth daily. Start taking on: September 02, 2023  Indication: Vitamin D Deficiency        Follow-up Information     Services, [facility]. Go on 09/04/2023.   Why: You have a hospital follow up appointment on 09/04/23 at 10:00 am to obtain group therapy and medication management services.    The appointment will be held in person.  * Please bring photo  ID and your insurance card/s with you. Contact information: [address] [place] [address] [phone]                 Follow-up recommendations:   Activity: as tolerated   Diet: heart healthy   Other: -Follow-up with your outpatient psychiatric provider -instructions on appointment date, time, and address (location) are provided to you in discharge paperwork.   -Take your psychiatric medications as prescribed at discharge - instructions are provided to you in the discharge paperwork   -Follow-up with outpatient primary care doctor and other specialists -for management of preventative medicine and chronic medical disease: NA   -Testing: Follow-up with outpatient provider for abnormal lab results: Vit D def   -If you are prescribed an atypical antipsychotic medication, we recommend that your outpatient psychiatrist follow routine screening for side effects within 3 months of discharge, including monitoring: AIMS scale, height, weight, blood pressure, fasting lipid panel, HbA1c, and fasting blood sugar.    -Recommend total abstinence from alcohol, tobacco, and other illicit drug use at discharge.    -If your psychiatric symptoms recur, worsen, or if you have side effects to your psychiatric medications, call your outpatient psychiatric provider, 911, [phone] or go to the nearest emergency department.   -If suicidal thoughts occur, immediately call your outpatient psychiatric provider, 911, 988 or go to the nearest emergency department.   Signed:  PGY-4 Bobbye Morton, MD 09/01/2023, 11:40 AM

## 2023-09-01 NOTE — Progress Notes (Signed)
  [facility] Adult Case Management Discharge Plan :  Will you be returning to the same living situation after discharge:  No. Pt will be discharging to [facility] in [place], [place]. At discharge, do you have transportation home?: Yes,  CSW arranged [employer] for 2:00PM Do you have the ability to pay for your medications: Yes,  pt has [employer]  Release of information consent forms completed and in the chart;  Patient's signature needed at discharge.  Patient to Follow up at:  Follow-up Information     [facility], [facility]. Go on 09/04/2023.   Why: You have a hospital follow up appointment on 09/04/23 at 10:00 am to obtain group therapy and medication management services.    The appointment will be held in person.  * Please bring photo ID and your insurance card/s with you. Contact information: [address] [place] [address] [phone]                 Next level of care provider has access to [facility] Link:no  Safety Planning and Suicide Prevention discussed: No. Reached out to pt's mother, Al-fayed,[name], [phone] who reports that she is unable to take pt back home at discharge, safety planning was not completed with mom.     Has patient been referred to the Quitline?: Patient does not use tobacco/nicotine products  Patient has been referred for addiction treatment: No known substance use disorder.  Kathi Der, LCSWA 09/01/2023, 11:17 AM

## 2023-09-01 NOTE — Telephone Encounter (Signed)
Patient Product/process development scientist completed.    The patient is insured through [employer] and [employer] [place].     Ran test claim for Abilify Maintena 400 mg prsy and Not on Formulary   This test claim was processed through [facility]- copay amounts may vary at other pharmacies due to [employer], or as the patient moves through the different stages of their insurance plan.     Roland Earl, CPHT Pharmacy Technician III Certified Patient Advocate [facility] Patient Advocate Team Direct Number: [phone]  Fax: [phone]

## 2023-09-01 NOTE — Progress Notes (Signed)
   08/31/23 2050  Psych Admission Type (Psych Patients Only)  Admission Status Involuntary  Psychosocial Assessment  Patient Complaints Anxiety;Depression  Eye Contact Brief  Facial Expression Flat  Affect Anxious;Depressed  Speech Logical/coherent  Interaction Cautious;Minimal  Motor Activity Other (Comment) (WDL)  Behavior Characteristics Cooperative  Mood Depressed;Anxious  Aggressive Behavior  Effect No apparent injury  Thought Process  Coherency WDL  Content WDL  Delusions None reported or observed  Perception WDL  Hallucination None reported or observed  Judgment Impaired  Confusion None  Danger to Self  Current suicidal ideation? Denies  Self-Injurious Behavior No self-injurious ideation or behavior indicators observed or expressed   Agreement Not to Harm Self Yes  Description of Agreement verbal  Danger to Others  Danger to Others None reported or observed

## 2023-09-01 NOTE — Progress Notes (Signed)
Pete Pelt  D/C'd Home per MD order.  Discussed with the patient and all questions fully answered.   An After Visit Summary was printed and given to the patient. Patient received prescription.  D/c education completed with patient including follow up instructions, medication list, d/c activities limitations if indicated, with other d/c instructions as indicated by MD - patient able to verbalize understanding, all questions fully answered.   Patient instructed to return to ED, call 911, or call MD for any changes in condition.   Patient escorted to the main entrance, and D/C home via [employer].  Daivon Rayos Matilde Sprang 09/01/2023 2:25 PM

## 2023-09-01 NOTE — [facility] Group Notes (Signed)

## 2023-09-01 NOTE — Group Note (Signed)
Recreation Therapy Group Note   Group Topic:Team Building  Group Date: 09/01/2023 Start Time: 0930 End Time: 1000 Facilitators: Michaeljames Milnes-McCall, LRT,CTRS Location: [address] Dayroom   Group Topic: Communication, Team Building, Problem Solving  Goal Area(s) Addresses:  Patient will effectively work with peer towards shared goal.  Patient will identify skills used to make activity successful.  Patient will share challenges and verbalize solution-driven approaches used. Patient will identify how skills used during activity can be used to reach post d/c goals.   Intervention: STEM Activity   Group Description: [employer]. Patients were provided the following materials: 2 drinking straws, 5 rubber bands, 5 paper clips, 2 index cards and 2 drinking cups. Using the provided materials patients were asked to build a launching mechanism to launch a ping pong ball across the room, approximately 10 feet. Patients were divided into teams of 3-5. Instructions required all materials be incorporated into the device, functionality of items left to the peer group's discretion.  Education: Pharmacist, community, Scientist, physiological, Air cabin crew, Building control surveyor.   Education Outcome: Acknowledges education/In group clarification offered/Needs additional education.    Affect/Mood: N/A   Participation Level: Did not attend    Clinical Observations/Individualized Feedback:      Plan: Continue to engage patient in RT group sessions 2-3x/week.   Kasarah Sitts-McCall, LRT,CTRS  09/01/2023 12:22 PM
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
## 2023-09-01 NOTE — Plan of Care (Signed)
  Problem: Activity: Goal: Interest or engagement in activities will improve Outcome: Progressing   Problem: Coping: Goal: Ability to verbalize frustrations and anger appropriately will improve Outcome: Progressing   Problem: Safety: Goal: Periods of time without injury will increase Outcome: Progressing   
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
## 2023-09-10 ENCOUNTER — Ambulatory Visit: Payer: 59 | Admitting: Internal Medicine

## 2024-01-22 ENCOUNTER — Ambulatory Visit (INDEPENDENT_AMBULATORY_CARE_PROVIDER_SITE_OTHER): Payer: MEDICAID

## 2024-01-22 VITALS — BP 124/72 | HR 60 | Resp 18 | Ht 71.0 in | Wt 174.0 lb

## 2024-01-22 DIAGNOSIS — F913 Oppositional defiant disorder: Secondary | ICD-10-CM | POA: Diagnosis not present

## 2024-01-22 DIAGNOSIS — F3113 Bipolar disorder, current episode manic without psychotic features, severe: Secondary | ICD-10-CM

## 2024-01-22 MED ORDER — SERTRALINE HCL 50 MG PO TABS
50.0000 mg | ORAL_TABLET | Freq: Every day | ORAL | 2 refills | Status: DC
Start: 2024-01-22 — End: 2024-03-16

## 2024-01-22 MED ORDER — ARIPIPRAZOLE 15 MG PO TABS: 15.0000 mg | ORAL_TABLET | Freq: Every day | ORAL | 2 refills | Status: DC

## 2024-01-22 MED ORDER — HYDROXYZINE HCL 25 MG PO TABS: 25.0000 mg | ORAL_TABLET | Freq: Three times a day (TID) | ORAL | 2 refills | Status: DC | PRN

## 2024-01-22 NOTE — Progress Notes (Unsigned)
   Established Patient Office Visit  Subjective   Patient ID: Edward [name], male    DOB: 21-Dec-2000  Age: 23 y.o. MRN: [id number]  Chief Complaint  Patient presents with   Follow-up    Discuss mental health     HPI  {History (Optional):23778}  ROS    Objective:     BP 124/72   Pulse 60   Resp 18   Ht 5\' 11"  (1.803 m)   Wt 174 lb 0.6 oz (78.9 kg)   SpO2 97%   BMI 24.27 kg/m  {Vitals History (Optional):23777}  Physical Exam   No results found for any visits on 01/22/24.  {Labs (Optional):23779}  The ASCVD Risk score (Arnett DK, et al., 2019) failed to calculate for the following reasons:   The 2019 ASCVD risk score is only valid for ages 44 to 7    Assessment & Plan:   Problem List Items Addressed This Visit   None   No follow-ups on file.    Alison Irvine, FNP

## 2024-01-22 NOTE — Patient Instructions (Signed)
 Medications sent to [facility] and referral to psychiatry placed.    Recommend follow-up in our office in 4-6 weeks.

## 2024-01-23 ENCOUNTER — Telehealth: Payer: Self-pay | Admitting: Pharmacy Technician

## 2024-01-23 ENCOUNTER — Other Ambulatory Visit (HOSPITAL_COMMUNITY): Payer: Self-pay

## 2024-01-23 NOTE — Telephone Encounter (Signed)
 Pharmacy Patient Advocate Encounter   Received notification from CoverMyMeds that prior authorization for ARIPiprazole  15MG  tablets is required/requested.   Insurance verification completed.   The patient is insured through [employer] [place] .   Per test claim: PA required; PA submitted to above mentioned insurance via CoverMyMeds Key/confirmation #/EOC [facility] Status is pending

## 2024-01-26 NOTE — Assessment & Plan Note (Signed)
 Patient has been without his medications for a few months and is experiencing an increase in anxiety and depression.  He denies any suicidal ideation at this time.   Will refill his Abilify  and Zoloft  until he can get reestablished with psychiatry.

## 2024-01-27 ENCOUNTER — Other Ambulatory Visit (HOSPITAL_COMMUNITY): Payer: Self-pay

## 2024-01-27 NOTE — Telephone Encounter (Signed)
 Pharmacy Patient Advocate Encounter  Received notification from [employer] [place] [employer] that Prior Authorization for ARIPiprazole  15MG  tablets has been APPROVED from 01/23/2024 to 01/22/2025. Unable to obtain price due to refill too soon rejection, last fill date 01/22/2024 next available fill date06/14/2025.   PA #/Case ID/Reference #: [id number]

## 2024-02-06 ENCOUNTER — Telehealth: Payer: Self-pay | Admitting: Internal Medicine

## 2024-02-06 NOTE — Telephone Encounter (Signed)
 Copied from CRM [id number]. Topic: Medical Record Request - Other >> Feb 06, 2024 10:47 AM Rosaria Common wrote: Reason for CRM: Pt needs a copy of a written statement stating that he is a pt of the clinic and needing written that mental health referral has been submitted and he is actively seeking mental health assistance for his employer. Callback number is [phone]

## 2024-02-06 NOTE — Telephone Encounter (Signed)
 Copied from CRM [id number]. Topic: Referral - Request for Referral >> Feb 06, 2024 10:41 AM Rosaria Common wrote: Did the patient discuss referral with their provider in the last year? No (If No - schedule appointment) (If Yes - send message)  Appointment offered? Yes  Type of order/referral and detailed reason for visit: Mental Health Therapist   Preference of office, provider, location: [place] or [facility]  If referral order, have you been seen by this specialty before? Yes (If Yes, this issue or another issue? When? Where?  Can we respond through MyChart? Yes

## 2024-02-10 NOTE — Telephone Encounter (Signed)
 Pt is picking up note from office this afternoon

## 2024-03-01 ENCOUNTER — Ambulatory Visit: Payer: MEDICAID

## 2024-03-16 ENCOUNTER — Ambulatory Visit (INDEPENDENT_AMBULATORY_CARE_PROVIDER_SITE_OTHER): Payer: MEDICAID

## 2024-03-16 DIAGNOSIS — F913 Oppositional defiant disorder: Secondary | ICD-10-CM

## 2024-03-16 DIAGNOSIS — F3113 Bipolar disorder, current episode manic without psychotic features, severe: Secondary | ICD-10-CM | POA: Diagnosis not present

## 2024-03-16 MED ORDER — SERTRALINE HCL 50 MG PO TABS
50.0000 mg | ORAL_TABLET | Freq: Every day | ORAL | 2 refills | Status: DC
Start: 2024-03-16 — End: 2024-05-29

## 2024-03-16 MED ORDER — HYDROXYZINE HCL 25 MG PO TABS: 25.0000 mg | ORAL_TABLET | Freq: Three times a day (TID) | ORAL | 5 refills | Status: DC | PRN

## 2024-03-16 MED ORDER — ARIPIPRAZOLE 15 MG PO TABS: 15.0000 mg | ORAL_TABLET | Freq: Every day | ORAL | 2 refills | Status: DC

## 2024-03-16 NOTE — Progress Notes (Addendum)
 Established Patient Office Visit  Subjective   Patient ID: Edward [name], male    DOB: 10/15/2000  Age: 23 y.o. MRN: [id number]  Chief Complaint  Patient presents with   Medical Management of Chronic Issues    4-7 week follow up, out of meds needs refills     HPI Depression, Follow-up  He  was last seen for this 2 months ago. Changes made at last visit include medications were renewed.   He reports poor compliance with treatment. He is not having side effects. He has been out of his medications for a few weeks.   He reports good tolerance of treatment. Current symptoms include: depressed mood, feelings of worthlessness/guilt, hopelessness, psychomotor retardation, and suicidal thoughts without plan He feels he is Unchanged since last visit.     03/16/2024    1:42 PM 01/22/2024   10:27 AM 06/10/2023    2:32 PM  Depression screen PHQ 2/9  Decreased Interest 2 2 1   Down, Depressed, Hopeless 3 3 2   PHQ - 2 Score 5 5 3   Altered sleeping 3 3 1   Tired, decreased energy 2 2 1   Change in appetite 2 2 1   Feeling bad or failure about yourself  3 3 3   Trouble concentrating 3 3 3   Moving slowly or fidgety/restless 1 1 0  Suicidal thoughts 3 3 0  PHQ-9 Score 22 22 12   Difficult doing work/chores Very difficult Very difficult Somewhat difficult    ----------------------------------------------------------------------------------------- Patient Active Problem List   Diagnosis Date Noted   Bipolar I disorder, current or most recent episode manic, severe with mixed features (HCC) 08/27/2023   Need for influenza vaccination 07/06/2023   Bipolar 1 disorder (HCC) 03/09/2023   Aggressive behavior    Suicidal ideation    DMDD (disruptive mood dysregulation disorder) (HCC) 10/22/2016   Oppositional defiant disorder, severe 01/09/2015      ROS    Objective:     BP 112/72   Pulse 63   Ht 5' 11 (1.803 m)   Wt 173 lb 0.6 oz (78.5 kg)   SpO2 97%   BMI 24.13 kg/m  BP Readings  from Last 3 Encounters:  03/16/24 112/72  01/22/24 124/72  08/26/23 123/74   Wt Readings from Last 3 Encounters:  03/16/24 173 lb 0.6 oz (78.5 kg)  01/22/24 174 lb 0.6 oz (78.9 kg)  08/13/23 172 lb (78 kg)      Physical Exam Vitals and nursing note reviewed.  Constitutional:      Appearance: Normal appearance.  HENT:     Head: Normocephalic.  Eyes:     Extraocular Movements: Extraocular movements intact.     Pupils: Pupils are equal, round, and reactive to light.  Cardiovascular:     Rate and Rhythm: Normal rate and regular rhythm.  Pulmonary:     Effort: Pulmonary effort is normal.     Breath sounds: Normal breath sounds.  Musculoskeletal:     Cervical back: Normal range of motion and neck supple.  Neurological:     Mental Status: He is alert and oriented to person, place, and time.  Psychiatric:        Attention and Perception: Attention normal.        Mood and Affect: Mood is depressed. Affect is flat.        Speech: Speech is delayed.        Behavior: Behavior is slowed. Behavior is cooperative.        Thought Content: Thought content normal. Thought  content does not include homicidal or suicidal ideation. Thought content does not include homicidal or suicidal plan.        Cognition and Memory: Cognition normal.      No results found for any visits on 03/16/24.    The ASCVD Risk score (Arnett DK, et al., 2019) failed to calculate for the following reasons:   The 2019 ASCVD risk score is only valid for ages 81 to 58    Assessment & Plan:   Problem List Items Addressed This Visit       Other   Oppositional defiant disorder, severe   Patient has been without his medications for a few weeks and is experiencing an increase in anxiety and depression.  He reports feelings of worthlessness and states that his situation is not good He lives with his mother and sister, and states that their is a low of tension and conflict within these relationships.     Will  refill his Abilify  and Zoloft  and update referral to psychiatry.  I have also place a referral for behavioral health since it may take some time for him to get an appointment with psychiatry.  Advised ED evaluation if experiencing any SI.        Relevant Medications   ARIPiprazole  (ABILIFY ) 15 MG tablet   hydrOXYzine  (ATARAX ) 25 MG tablet   sertraline  (ZOLOFT ) 50 MG tablet   Other Relevant Orders   Ambulatory referral to Behavioral Health   Ambulatory referral to Psychiatry   Bipolar I disorder, current or most recent episode manic, severe with mixed features [facility])   Patient has been without his medications for a few weeks and is experiencing an increase in anxiety and depression.  He reports feelings of worthlessness and states that his situation is not good He lives with his mother and sister, and states that their is a low of tension and conflict within these relationships.     Will refill his Abilify  and Zoloft  and update referral to psychiatry.  I have also place a referral for behavioral health since it may take some time for him to get an appointment with psychiatry.  Advised ED evaluation if experiencing any SI.        Relevant Medications   ARIPiprazole  (ABILIFY ) 15 MG tablet   hydrOXYzine  (ATARAX ) 25 MG tablet   sertraline  (ZOLOFT ) 50 MG tablet   Other Relevant Orders   Ambulatory referral to Behavioral Health   Ambulatory referral to Psychiatry    Return in about 6 weeks (around 04/27/2024) for chronic follow-up with PCP.   Patient verbally consented to [facility] services about presenting concerns and psychiatric consultation as appropriate. The services will be billed as appropriate for the patient.   Leita Longs, FNP

## 2024-03-17 NOTE — Assessment & Plan Note (Signed)
 Patient has been without his medications for a few weeks and is experiencing an increase in anxiety and depression.  He reports feelings of worthlessness and states that his situation is not good He lives with his mother and sister, and states that their is a low of tension and conflict within these relationships.     Will refill his Abilify  and Zoloft  and update referral to psychiatry.  I have also place a referral for behavioral health since it may take some time for him to get an appointment with psychiatry.  Advised ED evaluation if experiencing any SI.

## 2024-03-19 ENCOUNTER — Ambulatory Visit (INDEPENDENT_AMBULATORY_CARE_PROVIDER_SITE_OTHER): Payer: MEDICAID | Admitting: Professional Counselor

## 2024-03-19 DIAGNOSIS — F314 Bipolar disorder, current episode depressed, severe, without psychotic features: Secondary | ICD-10-CM

## 2024-03-19 DIAGNOSIS — F3113 Bipolar disorder, current episode manic without psychotic features, severe: Secondary | ICD-10-CM

## 2024-03-19 NOTE — Patient Instructions (Addendum)
 If your symptoms worsen or you have thoughts of suicide/homicide, PLEASE SEEK IMMEDIATE MEDICAL ATTENTION.  You may always call:   National Suicide Hotline: 988 or 831-757-6956 [place] Crisis Line: 226-246-1388 Crisis Recovery in [place]: 818-778-9685     These are available 24 hours a day, 7 days a week.    Patient recommended to go to behavioral health urgent care:   Facility Name: [facility] Address: [address], [place], [place] [address] Phone: [phone] Services: Walk-ins for initial encounter, assessment and establish care
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
## 2024-03-31 ENCOUNTER — Telehealth: Payer: Self-pay | Admitting: Professional Counselor

## 2024-03-31 DIAGNOSIS — F3113 Bipolar disorder, current episode manic without psychotic features, severe: Secondary | ICD-10-CM

## 2024-03-31 NOTE — BH Specialist Note (Signed)
 Virtual Behavioral Health Treatment Plan Team Note  MRN: [id number] NAME: Edward [name]  DATE: 04/13/24  Start time: Start Time: 0937 End time: Stop Time: 0945 Total time: Total Time in Minutes (Visit): 8  Total number of Virtual BH Treatment Team Plan encounters: 1/4  Treatment Team Attendees: Redell Corn, Christina Hussami, Sharlot Becker  Diagnoses:    ICD-10-CM   1. Bipolar I disorder, current or most recent episode manic, severe with mixed features (HCC)  F31.13       Goals, Interventions and Follow-up Plan Goals: Increase healthy adjustment to current life circumstances Interventions: Solution-Focused Strategies Supportive Counseling Link to [facility] Supportive Reflection Medication Management Recommendations: Defer Follow-up Plan: Refer to Psychiatrist for Medication Management Connect with [facility] ( shelter, financial assistance, food sources, community activities)  History of the present illness Presenting Problem/Current Symptoms:  Edward [name] is a 23 yo male reporting to [facility] for evaluation of current psychiatric symptoms. Edward [name] reports that he is experiencing daily lack of interest or pleasure in doing things, feeling depressed daily, is having primary insomnia with restless sleep when he does fall asleep, feels tired and unmotivated, has trouble focusing/concentrating, and is having thoughts that he would be better off dead. Pt was seen by his primary care provider 2 days ago and was given prescriptions for zoloft  and ability. Pt reports that he did not pick up the medication due to financial barriers. Edward [name] reports that he has been hospitalized this year for suicidal ideation/depression and has had psychiatric inpatient admissions in the past. Edward [name] had trouble articulating why he had the previous hospitalizations. Edward [name] denies that he has been in therapy recently and cites motivation as his primary barrier. Edward [name] lives with his mother and siblings,  which he reports are not supportive and home is a high conflict area for him. 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     Screenings PHQ-9 Assessments:     03/19/2024    3:49 PM 03/16/2024    1:42 PM 01/22/2024   10:27 AM  Depression screen PHQ 2/9  Decreased Interest 3 2 2   Down, Depressed, Hopeless 3 3 3   PHQ - 2 Score 6 5 5   Altered sleeping 3 3 3   Tired, decreased energy 3 2 2   Change in appetite 1 2 2   Feeling bad or failure about yourself  2 3 3   Trouble concentrating 3 3 3   Moving slowly or fidgety/restless 2 1 1   Suicidal thoughts 3 3 3   PHQ-9 Score 23 22 22   Difficult doing work/chores Very difficult Very difficult Very difficult   GAD-7 Assessments:     03/19/2024    3:51 PM 03/16/2024    1:43 PM 01/22/2024   10:27 AM 06/10/2023    2:32 PM  GAD 7 : Generalized Anxiety Score  Nervous, Anxious, on Edge 3 3 3 2   Control/stop worrying 3 3 3 2   Worry too much - different things 3 3 3 3   Trouble relaxing 3 3 3 2   Restless 1 2 2 1   Easily annoyed or irritable 3 3 3  0  Afraid - awful might happen 1 3 3 2   Total GAD 7 Score 17 20 20 12   Anxiety Difficulty Very difficult Very difficult Very difficult Very difficult    Past Medical History Past Medical History:  Diagnosis Date   ADHD    Bipolar 1 disorder (HCC)    Intellectual developmental disorder, mild    ODD (oppositional  defiant disorder)    Vitamin D  insufficiency 08/30/2023    Vital signs: There were no vitals filed for this visit.  Allergies:  Allergies as of 03/31/2024   (No Known Allergies)    Medication History Current medications:  Outpatient Encounter Medications as of 03/31/2024  Medication Sig   ARIPiprazole  (ABILIFY ) 15 MG tablet Take 1 tablet (15 mg total) by mouth daily.   hydrOXYzine  (ATARAX ) 25 MG tablet Take 1 tablet (25 mg total) by mouth 3 (three) times daily as needed for anxiety.   sertraline  (ZOLOFT ) 50 MG tablet Take 1  tablet (50 mg total) by mouth daily.   No facility-administered encounter medications on file as of 03/31/2024.     Scribe for Treatment Team: Redell JINNY Corn

## 2024-04-25 DIAGNOSIS — F314 Bipolar disorder, current episode depressed, severe, without psychotic features: Secondary | ICD-10-CM | POA: Insufficient documentation

## 2024-05-22 ENCOUNTER — Emergency Department (HOSPITAL_COMMUNITY)
Admission: EM | Admit: 2024-05-22 | Discharge: 2024-05-23 | Disposition: A | Discharge status: Other Acute Inpatient Hospital | Payer: MEDICAID | Attending: Emergency Medicine | Admitting: Emergency Medicine

## 2024-05-22 ENCOUNTER — Encounter (HOSPITAL_COMMUNITY): Payer: Self-pay | Admitting: Emergency Medicine

## 2024-05-22 ENCOUNTER — Other Ambulatory Visit: Payer: Self-pay

## 2024-05-22 DIAGNOSIS — T1491XA Suicide attempt, initial encounter: Secondary | ICD-10-CM | POA: Diagnosis not present

## 2024-05-22 DIAGNOSIS — R17 Unspecified jaundice: Secondary | ICD-10-CM

## 2024-05-22 DIAGNOSIS — T50902A Poisoning by unspecified drugs, medicaments and biological substances, intentional self-harm, initial encounter: Secondary | ICD-10-CM

## 2024-05-22 DIAGNOSIS — Z638 Other specified problems related to primary support group: Secondary | ICD-10-CM | POA: Insufficient documentation

## 2024-05-22 DIAGNOSIS — F4321 Adjustment disorder with depressed mood: Secondary | ICD-10-CM | POA: Diagnosis not present

## 2024-05-22 DIAGNOSIS — T50912A Poisoning by multiple unspecified drugs, medicaments and biological substances, intentional self-harm, initial encounter: Secondary | ICD-10-CM | POA: Insufficient documentation

## 2024-05-22 DIAGNOSIS — F341 Dysthymic disorder: Secondary | ICD-10-CM | POA: Diagnosis not present

## 2024-05-22 LAB — CBC
HCT: 43.4 % (ref 39.0–52.0)
Hemoglobin: 14.7 g/dL (ref 13.0–17.0)
MCH: 28.5 pg (ref 26.0–34.0)
MCHC: 33.9 g/dL (ref 30.0–36.0)
MCV: 84.1 fL (ref 80.0–100.0)
Platelets: 235 K/uL (ref 150–400)
RBC: 5.16 MIL/uL (ref 4.22–5.81)
RDW: 14.2 % (ref 11.5–15.5)
WBC: 6.3 K/uL (ref 4.0–10.5)
nRBC: 0 % (ref 0.0–0.2)

## 2024-05-22 LAB — COMPREHENSIVE METABOLIC PANEL WITH GFR
ALT: 21 U/L (ref 0–44)
AST: 22 U/L (ref 15–41)
Albumin: 4.2 g/dL (ref 3.5–5.0)
Alkaline Phosphatase: 47 U/L (ref 38–126)
Anion gap: 11 (ref 5–15)
BUN: 14 mg/dL (ref 6–20)
CO2: 29 mmol/L (ref 22–32)
Calcium: 9.6 mg/dL (ref 8.9–10.3)
Chloride: 101 mmol/L (ref 98–111)
Creatinine, Ser: 0.9 mg/dL (ref 0.61–1.24)
GFR, Estimated: >60 mL/min (ref >60)
Glucose, Bld: 75 mg/dL (ref 70–99)
Potassium: 4 mmol/L (ref 3.5–5.1)
Sodium: 141 mmol/L (ref 135–145)
Total Bilirubin: 1.8 mg/dL — ABNORMAL HIGH (ref 0.0–1.2)
Total Protein: 7.7 g/dL (ref 6.5–8.1)

## 2024-05-22 LAB — ETHANOL: Alcohol, Ethyl (B): <15 mg/dL (ref <15)

## 2024-05-22 LAB — SALICYLATE LEVEL: Salicylate Lvl: <7 mg/dL — ABNORMAL LOW (ref 7.0–30.0)

## 2024-05-22 LAB — CBG MONITORING, ED: Glucose-Capillary: 104 mg/dL — ABNORMAL HIGH (ref 70–99)

## 2024-05-22 LAB — ACETAMINOPHEN LEVEL: Acetaminophen (Tylenol), Serum: <10 ug/mL — ABNORMAL LOW (ref 10–30)

## 2024-05-22 NOTE — ED Notes (Addendum)
 Per poison control pt will need observation for 10 hours since we are unsure how much of each medication pt ingested. Initial labs they request are an Aspirin level, Magnesium  level, and CMP as well as a 4 hour tylenol  level, needs initial EKG and a repeat EKG in 6 hours. Poison control states pt needs to have IV fluids and his potassium and magnesium  levels to be at high normal. They report pt can have dizziness, drowsiness, tachycardia, seizures, agitation, QTC prolongation or QRS widening. Poison control would like to be called back at 0300 for an update. EDP made aware

## 2024-05-22 NOTE — ED Notes (Signed)
 Pt refusing to have EKG, EDP made aware.

## 2024-05-22 NOTE — ED Triage Notes (Signed)
 Pt bib EMS after pt called stating that he had taken a mixture of Hydralazine 25mg  tabs, Sertraline  50mg  tabs, and Aripiprazole  15mg  tabs. Reports he took approximately 7-8 pills 5 minutes before he called EMS.

## 2024-05-22 NOTE — ED Provider Notes (Signed)
 [place] [facility] AT [facility] Provider Note   CSN: [id number] Arrival date & time: 05/22/24  2218     Patient presents with: Drug Overdose   Edward [name] is a 23 y.o. male.  {Add pertinent medical, surgical, social history, OB history to YEP:67052} The history is provided by the patient and the EMS personnel.  Drug Overdose   He has history of attention deficit disorder, bipolar disorder, oppositional defiant disorder and comes in following a suicide attempt by overdosing.  He states that he took about 8 pills an hour ago, but he does not know what the pills were.  EMS relates that he had taken a mixture of hydralazine 25 mg, sertraline  50 mg, and aripiprazole  15 mg.  Poison control recommends observation for 10 hours following ingestion.  Patient states that he has had suicidal thoughts for a long time but that is the first time he has acted on it.  He denies ethanol and drug use.  He does admit to crying spells but denies early morning wakening, anhedonia, hallucinations.    Prior to Admission medications   Medication Sig Start Date End Date Taking? Authorizing Provider  ARIPiprazole  (ABILIFY ) 15 MG tablet Take 1 tablet (15 mg total) by mouth daily. 03/16/24   Bevely Doffing, FNP  hydrOXYzine  (ATARAX ) 25 MG tablet Take 1 tablet (25 mg total) by mouth 3 (three) times daily as needed for anxiety. 03/16/24   Bevely Doffing, FNP  sertraline  (ZOLOFT ) 50 MG tablet Take 1 tablet (50 mg total) by mouth daily. 03/16/24   Bevely Doffing, FNP    Allergies: Patient has no known allergies.    Review of Systems  All other systems reviewed and are negative.   Updated Vital Signs BP 129/78 (BP Location: Left Arm)   Pulse 70   Temp 97.8 F (36.6 C) (Oral)   Resp 18   Ht 5' 11 (1.803 m)   Wt 79 kg   SpO2 99%   BMI 24.29 kg/m   Physical Exam Vitals and nursing note reviewed.   23 year old male, resting comfortably and in no acute distress. Vital  signs are normal. Oxygen saturation is 99%, which is normal. Head is normocephalic and atraumatic. PERRLA, EOMI. Oropharynx is clear. Neck is nontender and supple without adenopathy. Lungs are clear without rales, wheezes, or rhonchi. Chest is nontender. Heart has regular rate and rhythm without murmur. Abdomen is soft, flat, nontender. Extremities have no cyanosis or edema, full range of motion is present. Skin is warm and dry without rash. Neurologic: Awake, alert, oriented.  Moves all extremities equally. Psychiatric: Depressed affect.  He makes poor eye contact and speaks very softly and speaks in a monotone.  He does not appear to be reacting to internal stimuli.  (all labs ordered are listed, but only abnormal results are displayed) Labs Reviewed  CBG MONITORING, ED - Abnormal; Notable for the following components:      Result Value   Glucose-Capillary 104 (*)    All other components within normal limits  COMPREHENSIVE METABOLIC PANEL WITH GFR  ETHANOL  CBC  RAPID URINE DRUG SCREEN, HOSP PERFORMED  SALICYLATE LEVEL  ACETAMINOPHEN  LEVEL    EKG: EKG Interpretation Date/Time:  Saturday May 22 2024 23:08:41 EDT Ventricular Rate:  65 PR Interval:  207 QRS Duration:  99 QT Interval:  396 QTC Calculation: 412 R Axis:   79  Text Interpretation: Sinus rhythm Borderline prolonged PR interval Early repolarization When compared with ECG of 08/26/2023,  No significant change was found Confirmed by Raford Lenis ([id number]) on 05/22/2024 11:14:00 PM  Radiology: No results found.  {Document cardiac monitor, telemetry assessment procedure when appropriate:32947} Procedures  Cardiac monitor shows normal sinus rhythm, per my interpretation. Medications Ordered in the ED - No data to display  Clinical Course as of 05/22/24 2302  Sat May 22, 2024  2242 Patient to ED after reportedly overdosing on multiple medications including Aripiprazole , hydralazine and sertraline . These were  taken within the last hour. No vomiting. The patient himself called EMS because I thought it would be quick and it wasn't.   Per Poison Control: 10 hours observation; EKG, salicylate, Cmet and 4-hour Tylenol  leve.   The patient is felt stable.  [SU]    Clinical Course User Index [SU] Odell Balls, PA-C   {Click here for ABCD2, HEART and other calculators REFRESH Note before signing:1}                              Medical Decision Making Amount and/or Complexity of Data Reviewed Labs: ordered.   Suicide attempt by drug overdose.  Per poison control, he will need 10 hours of observation from time of ingestion.  This would take him to 8 AM.  At that point, if there are no complications, he can be medically cleared.  In the meantime, involuntary commitment papers have been filled filed.  I have reviewed his past records, and note ED visit and hospitalization on 08/26/2023 for suicidal ideation as part of bipolar disorder.  I have reviewed his ECG, and my interpretation is early repolarization and otherwise normal ECG, normal QTc interval.  {Document critical care time when appropriate  Document review of labs and clinical decision tools ie CHADS2VASC2, etc  Document your independent review of radiology images and any outside records  Document your discussion with family members, caretakers and with consultants  Document social determinants of health affecting pt's care  Document your decision making why or why not admission, treatments were needed:32947:::1}   Final diagnoses:  None    ED Discharge Orders     None

## 2024-05-23 DIAGNOSIS — T1491XA Suicide attempt, initial encounter: Secondary | ICD-10-CM

## 2024-05-23 DIAGNOSIS — F341 Dysthymic disorder: Secondary | ICD-10-CM

## 2024-05-23 LAB — RAPID URINE DRUG SCREEN, HOSP PERFORMED
Amphetamines: NOT DETECTED
Barbiturates: NOT DETECTED
Benzodiazepines: NOT DETECTED
Cocaine: NOT DETECTED
Opiates: NOT DETECTED
Tetrahydrocannabinol: NOT DETECTED

## 2024-05-23 LAB — MAGNESIUM: Magnesium: 2.3 mg/dL (ref 1.7–2.4)

## 2024-05-23 MED ORDER — POTASSIUM CHLORIDE CRYS ER 20 MEQ PO TBCR
40.0000 meq | EXTENDED_RELEASE_TABLET | Freq: Once | ORAL | Status: AC
  Administered 2024-05-23: 40 meq via ORAL
  Filled 2024-05-23: qty 2

## 2024-05-23 NOTE — Consult Note (Signed)
 [facility] Consult Note  Patient Name: Edward [name] MRN: [id number] DOB: 24-Jun-2001 DATE OF Consult: 05/23/2024   TELEPSYCHIATRY ATTESTATION & CONSENT  As the provider for this telehealth consult, I attest that I verified the patient's identity using two separate identifiers, introduced myself to the patient, provided my credentials, disclosed my location, and performed this encounter via a HIPAA-compliant, real-time, face-to-face, two-way, interactive audio and video platform and with the full consent and agreement of the patient (or guardian as applicable.)  Patient physical location: [facility] . Telehealth provider physical location: home office in state of [place]  Video scheduled start time: 1500 (Central Time) Video end time: 1545 (Central Time)   PRIMARY PSYCHIATRIC DIAGNOSES (ICD-10 format preferred)  S/p suicide attempt multidrug overdosse Persistent depressive disorder,  Grief Interpersonal discord with mom and siblings  RECOMMENDATIONS      Medication recommendations: none /no emergent meds needed  Non-Medication/therapeutic recommendations: sitter     We recommend inpatient psychiatric hospitalization when medically cleared. Patient  is under voluntary admission status at this time; please IVC if attempts to leave  hospital.  We recommend inpatient psychiatric hospitalization    Plan Post Discharge/Psychiatric Care Follow-up resources will need follow up resources when released from INPT psych  Follow-Up Telepsychiatry C/L services: We will sign off for now. Please re-consult our service if needed for any concerning changes in the patient's condition, discharge planning, or questions.  Communication:  Treatment team members (and family members if applicable) who  were involved in treatment/care discussions and planning, and with whom we spoke  or engaged with via secure text/chat, include the following: ed staff   Thank you for involving us  in the care of  this patient. If you have any additional  questions or concerns, please call [phone] and ask for me or the provider on-call   CHIEF COMPLAINT/REASON FOR CONSULT  S/p overdose suicide attempt  HISTORY OF PRESENT ILLNESS (HPI)    23 year old male who's here in the emergency room on the psycho he's been boarding for the past five plus hours . patient here for possible overdose  brought in by ambulance.  patient apparently took a mixture of hydralazine Sertraline  and Abilify  . patient apparently has a history of attention deficit disorder, bipolar disorder, Oppositional defiant disorder.   patient apparently has been having suicidal thoughts for a long time this is the first time he has acted upon such. patient has other depressive symptoms including crying spells  ==================================  Interview shows patient lives with mother and siblings he is a youngest of all the siblings. patient having family difficulties feeling overwhelmed. stress building up at work and at home. recent death and family. Patient not taking any psychiatric medications. patient not having any therapist to talk with. patient alone in the struggles. patient claims Strife with mother yelling and screaming verbal arguments with family members to the point where a mother told patient " go kill yourself "".   patient proceeded and took an overdose of hydralazine, Sertraline  and abilify . prior to taking the overdose patient had called Emergency Services 911 for help.  patient  claimed he had worked the whole day and came home felt distressed  - argument with Mom tip the scales and precipitated his suicide attempt. patient has had suicidal thoughts for several months now. patient shows mild regret and his actions. he claims he has never attempted suicide until now. he denies any self-injury like cutting. he missed the one previous psychiatric hospitalization months ago for severe depression and  anxiety. Patient.  is currently  working is concerned that he will need an off work to know this sent to his work. he appears understand that after a suicide attempt overdose the standard of care is usually inpatient psychiatric hospitalization. he shows no evidence of any agitation no aggression, no hyperactivity no delusions, no hallucinations, no manic like behavior. overall he appears to be a cooperative in less distress verbal coherent Fair eye contact.    PAST PSYCHIATRIC HISTORY  Yes past psych hospital  Otherwise as per HPI above.  PAST MEDICAL HISTORY  Past Medical History:  Diagnosis Date   ADHD    Bipolar 1 disorder (HCC)    Intellectual developmental disorder, mild    ODD (oppositional defiant disorder)    Vitamin D  insufficiency 08/30/2023      HOME MEDICATIONS  (Not in a hospital admission)     none  ALLERGIES  No Known Allergies  SOCIAL & SUBSTANCE USE HISTORY  Social History   Socioeconomic History   Marital status: Single    Spouse name: Not on file   Number of children: Not on file   Years of education: Not on file   Highest education level: Not on file  Occupational History   Not on file  Tobacco Use   Smoking status: Never   Smokeless tobacco: Never  Substance and Sexual Activity   Alcohol use: No   Drug use: No   Sexual activity: Not on file  Other Topics Concern   Not on file  Social History Narrative   Not on file   Social Drivers of Health   Financial Resource Strain: Not on file  Food Insecurity: Patient Declined (08/26/2023)   Hunger Vital Sign    Worried About Running Out of Food in the Last Year: Patient declined    Ran Out of Food in the Last Year: Patient declined  Transportation Needs: Patient Declined (08/26/2023)   PRAPARE - Administrator, Civil Service (Medical): Patient declined    Lack of Transportation (Non-Medical): Patient declined  Physical Activity: Not on file  Stress: Not on file  Social Connections: Unknown (01/15/2022)   Received from  [facility]   Social Network    Social Network: Not on file   @SUDHX @ Additional pertinent information   No alcohol no weed no illegal drugs .   FAMILY HISTORY  History reviewed. No pertinent family history. Family Psychiatric History (if known):          MENTAL STATUS EXAM (MSE)  Mental Status Exam: General Appearance: Casual and Well Groomed  Orientation:  Full (Time, Place, and Person)  Memory:  intact  Concentration:  Concentration: Good  Recall:  Good  Attention  Fair  Eye Contact:  Fair  Speech:  Clear and Coherent  Language:  Good  Volume:  Normal  Mood: depressed  Affect:  Appropriate and Congruent  Thought Process:  Coherent  Thought Content:  Negative  Suicidal Thoughts:  Yes.  with intent/plan  Homicidal Thoughts:  No  Judgement:  Impaired  Insight:  Fair  Psychomotor Activity:  Normal  Akathisia:  No  Fund of Knowledge:  Fair    Assets:  Communication Skills  Cognition:  WNL  ADL's:  Intact  AIMS (if indicated):          VITALS (IF TAKEN)   @VSR @   BP 131/64   Pulse 78   Temp 98 F (36.7 C) (Oral)   Resp 15   Ht 5' 11 (1.803  m)   Wt 79 kg   SpO2 97%   BMI 24.29 kg/m    LABS that are pertinent     ROS & ADDITIONAL FINDINGS  ROS: Notable for the following relevant positive findings: Psychiatric: per hpi Other notable positive ROS findings: per hpi  Additional findings:      Musculoskeletal:    [x]  No Abnormal Movements Observed        []  Impaired      Gait & Station:        [x]  Normal        []  Wheelchair/Walker          []  Laying/Sitting       Pain Screening:   [x]  Denies    []  Present--mild to moderate     []  Present--severe (will                             consider referral for ongoing evaluation and treatment)      Nutrition & Dental Concerns:no gross dental or  eating disorder   RISK ASSESSMENT*  Is the patient experiencing any suicidal or homicidal ideations:     [x] YES        []  NO       Explain if yes: s/p suicide  attempt Protective factors considered for safety management: siblings  Risk factors/concerns considered for safety management: (check all that apply) []  Prior attempt                                      [x]  Hopelessness       []  Family history of suicide                    [x]  Impulsivity [x]  Depression                                         []  Aggression []  Substance abuse/dependence          []  Isolation []  Physical illness/chronic pain              []  Barriers to accessing treatment [x]  Recent loss                                        []  Unwillingness to seek help []  Access to lethal means                      [x]  Male gender []  Age over 12                                        [x]  Unmarried  Is there a Astronomer plan with the patient and treatment team to minimize risk factors and promote protective factors:     [x]  YES      []  NO            Explain: nursing observations       Based on my current evaluation and risk assessment of the patient at the time of this encounter, this patient is considered to be at:   []   Low Risk                      []   Moderate Risk                     [x]   High Risk  *RISK ASSESSMENT Risk assessment is a dynamic process; it is possible that this patient's condition, and risk level, may change. This should be re-evaluated and managed over time as appropriate. Please re-consult psychiatric consult services if additional assistance is needed in terms of risk assessment and management. If your team decides to discharge this patient, please advise the patient how to best access emergency psychiatric services, or to call 911, if their condition worsens or they feel unsafe in any way.   CW Lonni Ivanoff, M. D., PHEBE [facility]ON[facility] Consult Services

## 2024-05-23 NOTE — ED Notes (Addendum)
 Update given to poison control, advises that pt remains on cardiac monitoring until 0800.

## 2024-05-23 NOTE — ED Notes (Signed)
 Pt sitting in floor in doorway of room 19, Edward [name], sitter present.

## 2024-05-23 NOTE — ED Provider Notes (Addendum)
 Emergency Medicine Observation Re-evaluation Note  Edward [name] is a 23 y.o. male, seen on rounds today.  Pt initially presented to the ED for complaints of Drug Overdose Currently, the patient is asleep.  Pt took an OD yesterday evening as a suicide attempt.  He was observed overnight on a cardiac monitor and is now medically clear.  Physical Exam  BP (!) 107/59 (BP Location: Left Arm)   Pulse 63   Temp 98 F (36.7 C) (Oral)   Resp 13   Ht 5' 11 (1.803 m)   Wt 79 kg   SpO2 97%   BMI 24.29 kg/m  Physical Exam General: asleep Cardiac: rr Lungs: clear Psych: asleep  ED Course / MDM  EKG:EKG Interpretation Date/Time:  Sunday May 23 2024 05:15:17 EDT Ventricular Rate:  60 PR Interval:  222 QRS Duration:  103 QT Interval:  423 QTC Calculation: 423 R Axis:   37  Text Interpretation: Sinus rhythm Prolonged PR interval Abnormal inferior Q waves Early repolarization When compared with ECG of EARLIER SAME DATE No significant change was found Confirmed by Raford Lenis ([id number]) on 05/23/2024 5:18:35 AM  I have reviewed the labs performed to date as well as medications administered while in observation.  Recent changes in the last 24 hours include medical clearance.  Plan  Current plan is for TTS eval.  I have not ordered home meds yet as he just took an OD of them.    Dean Clarity, MD 05/23/24 KELLY    Dean Clarity, MD 05/23/24 [phone]

## 2024-05-23 NOTE — ED Notes (Addendum)
 Room setup for [facility], belongings placed in locker.

## 2024-05-23 NOTE — ED Notes (Signed)
 TTS on the phone with patient.

## 2024-05-23 NOTE — ED Notes (Signed)
 Pt has 2 bags of belongings, given to [employer] officer Dixon taking pt to [facility] with IVC paperwork and transport paperwork.

## 2024-05-23 NOTE — ED Notes (Signed)
 Pt dressed out in purple scrubs and belongings in locker.

## 2024-05-24 ENCOUNTER — Inpatient Hospital Stay (HOSPITAL_COMMUNITY)
Admission: AD | Admit: 2024-05-24 | Discharge: 2024-05-29 | DRG: 885 | Disposition: A | Discharge status: Home / Self Care | Payer: MEDICAID | Source: Intra-hospital

## 2024-05-24 ENCOUNTER — Encounter (HOSPITAL_COMMUNITY): Payer: Self-pay | Admitting: Psychiatry

## 2024-05-24 ENCOUNTER — Other Ambulatory Visit: Payer: Self-pay

## 2024-05-24 ENCOUNTER — Encounter (HOSPITAL_COMMUNITY): Payer: Self-pay

## 2024-05-24 DIAGNOSIS — F913 Oppositional defiant disorder: Secondary | ICD-10-CM | POA: Diagnosis present

## 2024-05-24 DIAGNOSIS — Z5948 Other specified lack of adequate food: Secondary | ICD-10-CM | POA: Diagnosis not present

## 2024-05-24 DIAGNOSIS — Z5986 Financial insecurity: Secondary | ICD-10-CM | POA: Diagnosis not present

## 2024-05-24 DIAGNOSIS — F411 Generalized anxiety disorder: Secondary | ICD-10-CM | POA: Diagnosis present

## 2024-05-24 DIAGNOSIS — Z818 Family history of other mental and behavioral disorders: Secondary | ICD-10-CM | POA: Diagnosis not present

## 2024-05-24 DIAGNOSIS — Z604 Social exclusion and rejection: Secondary | ICD-10-CM | POA: Diagnosis present

## 2024-05-24 DIAGNOSIS — F70 Mild intellectual disabilities: Secondary | ICD-10-CM | POA: Diagnosis present

## 2024-05-24 DIAGNOSIS — Z91148 Patient's other noncompliance with medication regimen for other reason: Secondary | ICD-10-CM

## 2024-05-24 DIAGNOSIS — Z9151 Personal history of suicidal behavior: Secondary | ICD-10-CM | POA: Diagnosis not present

## 2024-05-24 DIAGNOSIS — F314 Bipolar disorder, current episode depressed, severe, without psychotic features: Secondary | ICD-10-CM | POA: Diagnosis present

## 2024-05-24 DIAGNOSIS — Z5941 Food insecurity: Secondary | ICD-10-CM | POA: Diagnosis not present

## 2024-05-24 DIAGNOSIS — Z5982 Transportation insecurity: Secondary | ICD-10-CM | POA: Diagnosis not present

## 2024-05-24 DIAGNOSIS — F319 Bipolar disorder, unspecified: Secondary | ICD-10-CM | POA: Insufficient documentation

## 2024-05-24 DIAGNOSIS — F3163 Bipolar disorder, current episode mixed, severe, without psychotic features: Principal | ICD-10-CM | POA: Diagnosis present

## 2024-05-24 DIAGNOSIS — F909 Attention-deficit hyperactivity disorder, unspecified type: Secondary | ICD-10-CM | POA: Diagnosis present

## 2024-05-24 DIAGNOSIS — Z79899 Other long term (current) drug therapy: Secondary | ICD-10-CM

## 2024-05-24 DIAGNOSIS — F3113 Bipolar disorder, current episode manic without psychotic features, severe: Secondary | ICD-10-CM

## 2024-05-24 MED ORDER — LORAZEPAM 2 MG/ML IJ SOLN: 2.0000 mg | Freq: Three times a day (TID) | INTRAMUSCULAR | Status: DC | PRN

## 2024-05-24 MED ORDER — DIPHENHYDRAMINE HCL 50 MG/ML IJ SOLN: 50.0000 mg | Freq: Three times a day (TID) | INTRAMUSCULAR | Status: DC | PRN

## 2024-05-24 MED ORDER — MAGNESIUM HYDROXIDE 400 MG/5ML PO SUSP: 30.0000 mL | Freq: Every day | ORAL | Status: DC | PRN

## 2024-05-24 MED ORDER — HALOPERIDOL 5 MG PO TABS: 5.0000 mg | ORAL_TABLET | Freq: Three times a day (TID) | ORAL | Status: DC | PRN

## 2024-05-24 MED ORDER — LURASIDONE HCL 40 MG PO TABS
20.0000 mg | ORAL_TABLET | Freq: Every day | ORAL | Status: AC
  Administered 2024-05-24: 20 mg via ORAL
  Filled 2024-05-24: qty 1

## 2024-05-24 MED ORDER — DIPHENHYDRAMINE HCL 25 MG PO CAPS: 50.0000 mg | ORAL_CAPSULE | Freq: Three times a day (TID) | ORAL | Status: DC | PRN

## 2024-05-24 MED ORDER — HALOPERIDOL LACTATE 5 MG/ML IJ SOLN: 5.0000 mg | Freq: Three times a day (TID) | INTRAMUSCULAR | Status: DC | PRN

## 2024-05-24 MED ORDER — ALUM & MAG HYDROXIDE-SIMETH 200-200-20 MG/5ML PO SUSP: 30.0000 mL | ORAL | Status: DC | PRN

## 2024-05-24 MED ORDER — LURASIDONE HCL 40 MG PO TABS
40.0000 mg | ORAL_TABLET | Freq: Every day | ORAL | Status: DC
  Administered 2024-05-25: 40 mg via ORAL
  Filled 2024-05-24: qty 1

## 2024-05-24 MED ORDER — ACETAMINOPHEN 325 MG PO TABS: 650.0000 mg | ORAL_TABLET | Freq: Four times a day (QID) | ORAL | Status: DC | PRN

## 2024-05-24 MED ORDER — HYDROXYZINE HCL 25 MG PO TABS
25.0000 mg | ORAL_TABLET | Freq: Three times a day (TID) | ORAL | Status: DC | PRN
  Administered 2024-05-26 – 2024-05-28 (×4): 25 mg via ORAL
  Filled 2024-05-24 (×4): qty 1

## 2024-05-24 MED ORDER — HALOPERIDOL LACTATE 5 MG/ML IJ SOLN: 10.0000 mg | Freq: Three times a day (TID) | INTRAMUSCULAR | Status: DC | PRN

## 2024-05-24 NOTE — Plan of Care (Signed)
  Problem: Education: Goal: Emotional status will improve 05/24/2024 2256 by Sanaia Jasso, Shanda ORN, RN Outcome: Progressing Goal: Mental status will improve 05/24/2024 2256 by Shanard Treto, Shanda ORN, RN Outcome: Progressing

## 2024-05-24 NOTE — Group Note (Signed)
 Date:  05/24/2024 Time:  9:13 PM  Group Topic/Focus:  Wrap-Up Group:   The focus of this group is to help patients review their daily goal of treatment and discuss progress on daily workbooks. Alcoholics Anonymous (AA) Meeting    Participation Level:  Active  Participation Quality:  Appropriate  Affect:  Appropriate  Cognitive:  Appropriate  Insight: Appropriate  Engagement in Group:  Engaged  Modes of Intervention:  Discussion and Socialization  Additional Comments:  Patient attended AA  Eward Mace 05/24/2024, 9:13 PM

## 2024-05-24 NOTE — Plan of Care (Signed)
  Problem: Education: Goal: Knowledge of [place] [facility] Education information/materials will improve Outcome: Not Progressing Goal: Mental status will improve Outcome: Not Progressing

## 2024-05-24 NOTE — Plan of Care (Signed)
   Problem: Education: Goal: Ability to state activities that reduce stress will improve Outcome: Progressing   Problem: Coping: Goal: Ability to identify and develop effective coping behavior will improve Outcome: Progressing   Problem: Self-Concept: Goal: Ability to identify factors that promote anxiety will improve Outcome: Progressing Goal: Level of anxiety will decrease Outcome: Progressing

## 2024-05-24 NOTE — [facility] Group Notes (Signed)
 Spirituality Group   Group Goal: Support / Education around grief and loss    Group Description: Following introductions and group rules, group members engaged in facilitated group dialog and support around topic of loss, with particular support around experiences of loss in their lives. Group members identified types of loss (relationships / self / things) as well as patterns, circumstances, and changes that precipitate loss. Reflection invited on thoughts / feelings around loss, normalized grief responses, and recognized variety in grief experience. Group noted Worden's four tasks of grief in discussion. Group drew on Adlerian / Rogerian, narrative, MI, with Yalom's group therapy as a primary framework.   Observations: Edward [name] was present for group but was reserved, affect rather flat.  Edward [name] HERO.Div

## 2024-05-24 NOTE — Group Note (Signed)
 Recreation Therapy Group Note   Group Topic:Health and Wellness  Group Date: 05/24/2024 Start Time: 0932 End Time: 0958 Facilitators: Wally Behan-McCall, LRT,CTRS Location: [address] Dayroom   Group Topic: Wellness  Goal Area(s) Addresses:  Patient will define components of whole wellness. Patient will verbalize benefit of whole wellness.  Behavioral Response: None  Intervention: Music  Activity: Exercise. LRT and patients discussed the importance of physical fitness. Patients then took turns leading the group in the exercises of their choosing. Patients completed two rounds of exercise. Patients could take breaks and get water if needed.   Education: Wellness, Building control surveyor.   Education Outcome: Acknowledges education/In group clarification offered/Needs additional education.    Affect/Mood: Flat   Participation Level: None   Participation Quality: None   Behavior: On-looking   Speech/Thought Process: None   Insight: None   Judgement: None   Modes of Intervention: Music   Patient Response to Interventions:  Attentive   Education Outcome:  In group clarification offered    Clinical Observations/Individualized Feedback: Pt came in late appearing to have just woken up. Pt was quiet standing next to the door. Pt didn't engage in activity.      Plan: Continue to engage patient in RT group sessions 2-3x/week.   Rykin Route-McCall, LRT,CTRS 05/24/2024 12:49 PM

## 2024-05-24 NOTE — [facility] Suicide Risk Assessment (Signed)
 Suicide Risk Assessment  Admission Assessment    [facility] Admission Suicide Risk Assessment   Nursing information obtained from:  Patient Demographic factors:  Male Current Mental Status:  Suicidal ideation indicated by others Loss Factors:  Financial problems / change in socioeconomic status Historical Factors:  Impulsivity, Family history of mental illness or substance abuse Risk Reduction Factors:  Employed, Living with another person, especially a relative  Total Time spent with patient: 45 minutes Principal Problem: Bipolar disorder (HCC) Diagnosis:  Principal Problem:   Bipolar disorder (HCC)  Subjective Data: See H&P  Continued Clinical Symptoms:  Alcohol Use Disorder Identification Test Final Score (AUDIT): 2 The Alcohol Use Disorders Identification Test, Guidelines for Use in Primary Care, Second Edition.  World Science writer [facility]). Score between 0-7:  no or low risk or alcohol related problems. Score between 8-15:  moderate risk of alcohol related problems. Score between 16-19:  high risk of alcohol related problems. Score 20 or above:  warrants further diagnostic evaluation for alcohol dependence and treatment.   CLINICAL FACTORS:   Severe Anxiety and/or Agitation Depression:   Anhedonia Hopelessness Impulsivity Recent sense of peace/wellbeing Severe More than one psychiatric diagnosis Unstable or Poor Therapeutic Relationship Previous Psychiatric Diagnoses and Treatments   Musculoskeletal: Strength & Muscle Tone: within normal limits Gait & Station: normal Patient leans: N/A  Psychiatric Specialty Exam:  Presentation  General Appearance:  Appropriate for Environment  Eye Contact: Minimal  Speech: Clear and Coherent  Speech Volume: Normal  Handedness: Right   Mood and Affect  Mood: Depressed; Hopeless; Worthless  Affect: Solicitor Processes: Linear  Descriptions of  Associations:Intact  Orientation:Full (Time, Place and Person)  Thought Content:Logical  History of Schizophrenia/Schizoaffective disorder:No data recorded Duration of Psychotic Symptoms:No data recorded Hallucinations:Hallucinations: None  Ideas of Reference:None  Suicidal Thoughts:Suicidal Thoughts: No  Homicidal Thoughts:Homicidal Thoughts: No   Sensorium  Memory: Immediate Fair; Recent Fair  Judgment: Poor  Insight: Poor   Executive Functions  Concentration: Fair  Attention Span: Fair  Recall: Fair  Fund of Knowledge: Poor  Language: Fair   Psychomotor Activity  Psychomotor Activity:Psychomotor Activity: Normal   Assets  Assets: Resilience   Sleep  Sleep:Sleep: Good    Physical Exam: Physical Exam ROS Blood pressure 120/80, pulse 60, temperature 98.3 F (36.8 C), temperature source Oral, resp. rate 16, height 5' 11 (1.803 m), weight 79.1 kg. Body mass index is 24.32 kg/m.   COGNITIVE FEATURES THAT CONTRIBUTE TO RISK:  None    SUICIDE RISK:   Severe:  Frequent, intense, and enduring suicidal ideation, specific plan, no subjective intent, but some objective markers of intent (i.e., choice of lethal method), the method is accessible, some limited preparatory behavior, evidence of impaired self-control, severe dysphoria/symptomatology, multiple risk factors present, and few if any protective factors, particularly a lack of social support.  PLAN OF CARE: See H&P  I certify that inpatient services furnished can reasonably be expected to improve the patient's condition.   Blair Chiquita Hint, NP 05/24/2024, 12:39 PM

## 2024-05-24 NOTE — Progress Notes (Signed)
   05/24/24 2200  Psych Admission Type (Psych Patients Only)  Admission Status Involuntary  Psychosocial Assessment  Patient Complaints Anxiety;Depression  Eye Contact Brief  Facial Expression Flat  Affect Anxious;Sullen  Speech Logical/coherent  Interaction Poor  Motor Activity Slow  Appearance/Hygiene In scrubs  Behavior Characteristics Cooperative  Mood Depressed  Thought Process  Coherency WDL  Content WDL  Delusions None reported or observed  Perception WDL  Hallucination None reported or observed  Judgment Limited  Confusion None  Danger to Self  Current suicidal ideation? Denies  Agreement Not to Harm Self Yes  Description of Agreement Verbal  Danger to Others  Danger to Others None reported or observed

## 2024-05-24 NOTE — Progress Notes (Signed)
   05/24/24 1200  Psych Admission Type (Psych Patients Only)  Admission Status Involuntary  Psychosocial Assessment  Patient Complaints Anxiety;Depression  Eye Contact Brief  Facial Expression Blank;Flat  Affect Anxious;Sad  Speech Logical/coherent  Interaction Poor  Motor Activity Slow  Appearance/Hygiene Body odor  Behavior Characteristics Cooperative  Mood Depressed  Thought Process  Coherency WDL  Content WDL  Delusions None reported or observed  Perception WDL  Hallucination None reported or observed  Judgment Poor  Confusion None  Danger to Self  Current suicidal ideation? Denies  Description of Suicide Plan No Plan  Agreement Not to Harm Self Yes  Description of Agreement Verbal Contract  Danger to Others  Danger to Others None reported or observed

## 2024-05-24 NOTE — Progress Notes (Signed)
 CONTACT NOTE:   Dennie Emergency planning/management officer at [employer]) [phone]  With patient's consent, CSW attempted to reach patient's manager to inform employer of patient's hospitalization. CSW left a voicemail requesting a call back.   Louetta Lame, LCSW-A
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
## 2024-05-24 NOTE — BH IP Treatment Plan (Signed)
 Interdisciplinary Treatment and Diagnostic Plan Update  05/24/2024 Time of Session: 11:10AM Edward [name] MRN: [id number]  Principal Diagnosis: Bipolar disorder [facility])  Secondary Diagnoses: Principal Problem:   Bipolar disorder (HCC)   Current Medications:  Current Facility-Administered Medications  Medication Dose Route Frequency Provider Last Rate Last Admin   acetaminophen  (TYLENOL ) tablet 650 mg  650 mg Oral Q6H PRN Bobbitt, Shalon E, NP       alum & mag hydroxide-simeth (MAALOX/MYLANTA) 200-200-20 MG/5ML suspension 30 mL  30 mL Oral Q4H PRN Bobbitt, Shalon E, NP       haloperidol  (HALDOL ) tablet 5 mg  5 mg Oral TID PRN Bobbitt, Shalon E, NP       And   diphenhydrAMINE  (BENADRYL ) capsule 50 mg  50 mg Oral TID PRN Bobbitt, Shalon E, NP       haloperidol  lactate (HALDOL ) injection 5 mg  5 mg Intramuscular TID PRN Bobbitt, Shalon E, NP       And   diphenhydrAMINE  (BENADRYL ) injection 50 mg  50 mg Intramuscular TID PRN Bobbitt, Shalon E, NP       And   LORazepam  (ATIVAN ) injection 2 mg  2 mg Intramuscular TID PRN Bobbitt, Shalon E, NP       haloperidol  lactate (HALDOL ) injection 10 mg  10 mg Intramuscular TID PRN Bobbitt, Shalon E, NP       And   diphenhydrAMINE  (BENADRYL ) injection 50 mg  50 mg Intramuscular TID PRN Bobbitt, Shalon E, NP       And   LORazepam  (ATIVAN ) injection 2 mg  2 mg Intramuscular TID PRN Bobbitt, Shalon E, NP       hydrOXYzine  (ATARAX ) tablet 25 mg  25 mg Oral TID PRN Bennett, Christal H, NP       lurasidone  (LATUDA ) tablet 20 mg  20 mg Oral QHS Green, Terri L, RPH       [START ON 05/25/2024] lurasidone  (LATUDA ) tablet 40 mg  40 mg Oral QHS Bennett, Christal H, NP       magnesium  hydroxide (MILK OF MAGNESIA) suspension 30 mL  30 mL Oral Daily PRN Bobbitt, Shalon E, NP       PTA Medications: Medications Prior to Admission  Medication Sig Dispense Refill Last Dose/Taking   ARIPiprazole  (ABILIFY ) 15 MG tablet Take 1 tablet (15 mg total) by mouth daily. (Patient  not taking: Reported on 05/23/2024) 30 tablet 2    hydrOXYzine  (ATARAX ) 25 MG tablet Take 1 tablet (25 mg total) by mouth 3 (three) times daily as needed for anxiety. 90 tablet 5    sertraline  (ZOLOFT ) 50 MG tablet Take 1 tablet (50 mg total) by mouth daily. (Patient not taking: Reported on 05/23/2024) 90 tablet 2     Patient Stressors:    Patient Strengths:    Treatment Modalities: Medication Management, Group therapy, Case management,  1 to 1 session with clinician, Psychoeducation, Recreational therapy.   Physician Treatment Plan for Primary Diagnosis: Bipolar disorder (HCC) Long Term Goal(s): Improvement in symptoms so as ready for discharge   Short Term Goals: Ability to identify changes in lifestyle to reduce recurrence of condition will improve  Medication Management: Evaluate patient's response, side effects, and tolerance of medication regimen.  Therapeutic Interventions: 1 to 1 sessions, Unit Group sessions and Medication administration.  Evaluation of Outcomes: Not Progressing  Physician Treatment Plan for Secondary Diagnosis: Principal Problem:   Bipolar disorder (HCC)  Long Term Goal(s): Improvement in symptoms so as ready for discharge   Short Term Goals: Ability  to identify changes in lifestyle to reduce recurrence of condition will improve     Medication Management: Evaluate patient's response, side effects, and tolerance of medication regimen.  Therapeutic Interventions: 1 to 1 sessions, Unit Group sessions and Medication administration.  Evaluation of Outcomes: Not Progressing   RN Treatment Plan for Primary Diagnosis: Bipolar disorder (HCC) Long Term Goal(s): Knowledge of disease and therapeutic regimen to maintain health will improve  Short Term Goals: Ability to remain free from injury will improve, Ability to verbalize frustration and anger appropriately will improve, Ability to demonstrate self-control, Ability to participate in decision making will  improve, Ability to verbalize feelings will improve, Ability to disclose and discuss suicidal ideas, and Compliance with prescribed medications will improve  Medication Management: RN will administer medications as ordered by provider, will assess and evaluate patient's response and provide education to patient for prescribed medication. RN will report any adverse and/or side effects to prescribing provider.  Therapeutic Interventions: 1 on 1 counseling sessions, Psychoeducation, Medication administration, Evaluate responses to treatment, Monitor vital signs and CBGs as ordered, Perform/monitor CIWA, COWS, AIMS and Fall Risk screenings as ordered, Perform wound care treatments as ordered.  Evaluation of Outcomes: Not Progressing   LCSW Treatment Plan for Primary Diagnosis: Bipolar disorder [facility]) Long Term Goal(s): Safe transition to appropriate next level of care at discharge, Engage patient in therapeutic group addressing interpersonal concerns.  Short Term Goals: Engage patient in aftercare planning with referrals and resources, Increase social support, Increase ability to appropriately verbalize feelings, Increase emotional regulation, Facilitate acceptance of mental health diagnosis and concerns, Facilitate patient progression through stages of change regarding substance use diagnoses and concerns, and Increase skills for wellness and recovery  Therapeutic Interventions: Assess for all discharge needs, 1 to 1 time with Social worker, Explore available resources and support systems, Assess for adequacy in community support network, Educate family and significant other(s) on suicide prevention, Complete Psychosocial Assessment, Interpersonal group therapy.  Evaluation of Outcomes: Not Progressing   Progress in Treatment: Attending groups: Yes. Participating in groups: No. Taking medication as prescribed: Yes. Toleration medication: Yes. Family/Significant other contact made: No, will  contact:  family/friends once consent is given. Patient understands diagnosis: Yes. Discussing patient identified problems/goals with staff: Yes. Medical problems stabilized or resolved: Yes. Denies suicidal/homicidal ideation: Yes. Issues/concerns per patient self-inventory: No.   Patient Goals:  Not being hopeless  Discharge Plan or Barriers:   Reason for Continuation of Hospitalization: Medication stabilization Withdrawal symptoms  Estimated Length of Stay: 5-7 days  Last 3 [place] Suicide Severity Risk Score: Flowsheet Row [facility] ([facility]/22[facility]2024 in [facility] from 03/19/2024 in [facility]  C-SSRS RISK CATEGORY High Risk High Risk Moderate Risk    Last PHQ 2/9 Scores:    03/19/2024    3:49 PM 03/16/2024    1:42 PM 01/22/2024   10:27 AM  Depression screen PHQ 2/9  Decreased Interest 3 2 2   Down, Depressed, Hopeless 3 3 3   PHQ - 2 Score 6 5 5   Altered sleeping 3 3 3   Tired, decreased energy 3 2 2   Change in appetite 1 2 2   Feeling bad or failure about yourself  2 3 3   Trouble concentrating 3 3 3   Moving slowly or fidgety/restless 2 1 1   Suicidal thoughts 3 3 3   PHQ-9 Score 23 22 22   Difficult doing work/chores Very difficult  Very difficult Very difficult    Scribe for Treatment Team: Danalee Flath M Ruie Sendejo, LCSWA 05/24/2024 1:31 PM

## 2024-05-25 LAB — HEMOGLOBIN A1C
Hgb A1c MFr Bld: 5.2 % (ref 4.8–5.6)
Mean Plasma Glucose: 102.54 mg/dL

## 2024-05-25 LAB — LIPID PANEL
Cholesterol: 174 mg/dL (ref 0–200)
HDL: 40 mg/dL — ABNORMAL LOW (ref >40)
LDL Cholesterol: 117 mg/dL — ABNORMAL HIGH (ref 0–99)
Total CHOL/HDL Ratio: 4.4 ratio
Triglycerides: 85 mg/dL (ref <150)
VLDL: 17 mg/dL (ref 0–40)

## 2024-05-25 NOTE — Progress Notes (Signed)
   05/25/24 2300  Psych Admission Type (Psych Patients Only)  Admission Status Involuntary  Psychosocial Assessment  Patient Complaints Depression;Isolation  Eye Contact Brief  Facial Expression Flat  Affect Depressed  Speech Logical/coherent  Interaction Isolative;Minimal  Motor Activity Slow  Appearance/Hygiene Unremarkable  Behavior Characteristics Cooperative  Mood Depressed  Aggressive Behavior  Effect No apparent injury  Thought Process  Coherency WDL  Content WDL  Delusions None reported or observed  Perception WDL  Hallucination None reported or observed  Judgment Impaired  Confusion None  Danger to Self  Current suicidal ideation? Denies  Agreement Not to Harm Self Yes  Description of Agreement Verbal  Danger to Others  Danger to Others None reported or observed

## 2024-05-25 NOTE — [facility] Group Notes (Signed)
 Psychoeducational Group Note  Date:  05/25/2024 Time:  2000  Group Topic/Focus:  Wrap up group  Participation Level: Did Not Attend  Participation Quality:  Not Applicable  Affect:  Not Applicable  Cognitive:  Not Applicable  Insight:  Not Applicable  Engagement in Group: Not Applicable  Additional Comments:  Did not attend.   Lenora Manuelita RAMAN 05/25/2024, 8:49 PM

## 2024-05-25 NOTE — [facility] Suicide Risk Assessment (Signed)
 [facility] INPATIENT:  Family/Significant Other Suicide Prevention Education  Suicide Prevention Education:  Contact Attempts: Armando Daria Hickman (mother) [phone], (name of family member/significant other) has been identified by the patient as the family member/significant other with whom the patient will be residing, and identified as the person(s) who will aid the patient in the event of a mental health crisis.  With written consent from the patient, an attempt was made to provide suicide prevention education, prior to and/or following the patient's discharge.  We were unsuccessful in providing suicide prevention education, CSW will attempt again prior to patient discharge.   Date and time of first attempt:05/25/24/11:43  Edward [name] 05/25/2024, 12:47 PM
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
## 2024-05-25 NOTE — Progress Notes (Signed)
   05/25/24 1000  Psych Admission Type (Psych Patients Only)  Admission Status Involuntary  Psychosocial Assessment  Patient Complaints Anxiety;Depression  Eye Contact Brief  Facial Expression Flat  Affect Anxious;Sullen  Speech Logical/coherent  Interaction Poor  Motor Activity Slow  Appearance/Hygiene Unremarkable  Behavior Characteristics Cooperative  Mood Depressed  Thought Process  Coherency WDL  Content WDL  Delusions None reported or observed  Perception WDL  Hallucination None reported or observed  Judgment Impaired  Confusion Moderate  Danger to Self  Current suicidal ideation? Denies  Description of Suicide Plan No Plan  Agreement Not to Harm Self Yes  Description of Agreement Verbal Contract  Danger to Others  Danger to Others None reported or observed

## 2024-05-25 NOTE — Group Note (Signed)
 Date:  05/25/2024 Time:  3:43 PM  Group Topic/Focus:  Using Ask Me 3 to Understand Treatment     Participation Level:  Active  Participation Quality:  Appropriate  Affect:  Flat  Cognitive:  Appropriate  Insight: Appropriate  Engagement in Group:  Engaged  Modes of Intervention:  Discussion and Education  Additional Comments:  Pt engaged actively within the discussion ans had a goal to ask questions to understand the medications he needs to take in order for discharge.   Mikaiah Stoffer 05/25/2024, 3:43 PM

## 2024-05-25 NOTE — Group Note (Signed)
 LCSW Group Therapy Note   Group Date: 05/25/2024 Start Time: 1100 End Time: 1200   Participation:  did not attend  Type of Therapy:  Group Therapy  Topic:  Title: Finding Balance: Using Wise Mind for Thoughtful Decisions  Objective:  To help participants understand and apply the concept of Delsie Mind to make balanced, thoughtful decisions by integrating emotion and logic.  Goals: Learn the differences between Emotional Mind, Reasonable Mind, and [employer]. Recognize personal signs of Emotional and Reasonable Mind. Practice using [employer] in real-life scenarios.  Therapeutic Modalities: Elements of Dialectical Behavior Therapy (DBT):  Mindfulness (noticing thoughts and emotions without judgment), Emotion Regulation (understanding and managing emotional responses), Distress Tolerance (coping with difficult situations without making them worse), Wise Mind (integrating emotion and reason for balanced decision-making) Elements of Cognitive Behavioral Therapy (CBT):  Identifying automatic thoughts, Challenging cognitive distortions, Using logic to reframe unhelpful thinking patterns  Summary:  This class focused on Wise Mind--DBT's concept of balancing Emotional Mind and Reasonable Mind. We identified when we're in each state and practiced using Wise Mind to respond thoughtfully in real-life situations. By combining emotion and logic, participants can improve decision-making, manage challenges, and enhance relationships.   Asees Manfredi O [name], LCSWA 05/25/2024  12:14 PM

## 2024-05-25 NOTE — [facility] Group Notes (Signed)
 Psychoeducational Group Note  Date:  05/25/2024 Time:  8:30  Group Topic/Focus:  Goals Group:   The focus of this group is to help patients establish daily goals to achieve during treatment and discuss how the patient can incorporate goal setting into their daily lives to aide in recovery.  Participation Level: Did Not Attend  Participation Quality:  Not Applicable  Affect:  Not Applicable  Cognitive:  Not Applicable  Insight:  Not Applicable  Engagement in Group: Not Applicable  Additional Comments:  Pt was in bed asleep.  Crecencio Kwiatek, [place] 05/25/2024, 9:32 AM

## 2024-05-25 NOTE — Group Note (Signed)
 Recreation Therapy Group Note   Group Topic:Animal Assisted Therapy   Group Date: 05/25/2024 Start Time: 9050 End Time: 1030 Facilitators: Cesiah Westley-McCall, LRT,CTRS Location: [address] Dayroom   Animal-Assisted Activity (AAA) Program Checklist/Progress Notes Patient Eligibility Criteria Checklist & Daily Group note for Rec Tx Intervention  AAA/T Program Assumption of Risk Form signed by Patient/ or Parent Legal Guardian Yes  Patient understands his/her participation is voluntary Yes  Behavioral Response:    Education: Charity fundraiser, Appropriate Animal Interaction   Education Outcome: Acknowledges education.    Affect/Mood: N/A   Participation Level: Did not attend    Clinical Observations/Individualized Feedback:     Plan: Continue to engage patient in RT group sessions 2-3x/week.   Edward [name], LRT,CTRS  05/25/2024 11:58 AM

## 2024-05-25 NOTE — Progress Notes (Signed)
 [facility] MD Progress Note  05/25/2024 8:35 AM Edward [name]  MRN:  [id number]  Principal Problem: Bipolar 1 disorder, depressed, severe (HCC) Diagnosis: Principal Problem:   Bipolar 1 disorder, depressed, severe (HCC)  Total Time spent with patient: 45 minutes  Reason for Admission:  Edward [name] is a 23 year old male with a psychiatric history significant for anxiety, attention deficit disorder, bipolar disorder, oppositional defiant disorder and major depressive disorder presented to [facility] via EMS after an intentional overdose in the context of familial conflict.  He reported ingesting approximately 8 tablets of his prescribed psychotropic medications: Sertraline  50 mg, Abilify  15 mg, and hydroxyzine  25 mg.  Patient reportedly self initiated the 911 call reporting overdose. Following medical stabilization and clearance, patient was admitted to the [facility].  Chart review indicates a recent psychiatric hospitalization, with discharge from the [facility] in December 2024.     24-hour chart review: Chart reviewed. Patient's case discussed in interdisciplinary team meeting.  Vital signs reviewed without critical value. No as needed psychotropic medication required overnight.  He slept a documented 8.5 hours.  He is adherent to taking psychotropic medication regimen. Nursing notes indicate no behavioral challenges on the unit.  Daily Evaluation: Edward [name] was observed lying in bed. He describes his mood as neutral, with depression rated 4-5/10 and anxiety rated 0/10. He denies suicidal ideation, passive thoughts of death, or self-harm urges. He also denies homicidal ideation and psychotic symptoms. He reports his appetite as "okay," reporting he ate cereal for breakfast, and denies any sleep difficulties. He describes his energy as normal and concentration as adequate. He denies side effects from current psychiatric medications. States he attended all groups  yesterday but reports that he is mostly listening rather than participating; he was encouraged to increase his participation. He shared that he spoke with his mother by phone last night and described the interaction as "fine." He expresses interest in outpatient therapy referral, reporting preference for virtual due to transportation barriers. He reports prior participation in family therapy as a minor and is open to family therapy again if his family members are willing. Reports h plans to discharge to his mother's home.   Edward [name] presents with a flat affect and appears depressed but remains cooperative. No manic symptoms or psychotic features are observed. His primary stressor appears to be familial conflict. He is tolerating medications well without adverse effects. Given his history of overdose prior to admission, continued inpatient treatment is warranted at this time. Consideration may be given to ACT team referral if appropriate. The current medication regimen will be maintained.   Past Psychiatric History: See H&P  Past Medical History:  Past Medical History:  Diagnosis Date   ADHD    Bipolar 1 disorder (HCC)    Intellectual developmental disorder, mild    ODD (oppositional defiant disorder)    Vitamin D  insufficiency 08/30/2023   History reviewed. No pertinent surgical history. Family History: History reviewed. No pertinent family history. Family Psychiatric  History: See H&P Social History:  Social History   Substance and Sexual Activity  Alcohol Use No     Social History   Substance and Sexual Activity  Drug Use No    Social History   Socioeconomic History   Marital status: Single    Spouse name: Not on file   Number of children: Not on file   Years of education: Not on file   Highest education level: Not on file  Occupational History   Not on file  Tobacco Use   Smoking status: Never   Smokeless tobacco: Never  Substance and Sexual Activity   Alcohol use: No    Drug use: No   Sexual activity: Not on file  Other Topics Concern   Not on file  Social History Narrative   Not on file   Social Drivers of Health   Financial Resource Strain: Not on file  Food Insecurity: Food Insecurity Present (05/24/2024)   Hunger Vital Sign    Worried About Running Out of Food in the Last Year: Sometimes true    Ran Out of Food in the Last Year: Sometimes true  Transportation Needs: No Transportation Needs (05/24/2024)   PRAPARE - Administrator, Civil Service (Medical): No    Lack of Transportation (Non-Medical): No  Physical Activity: Not on file  Stress: Not on file  Social Connections: Unknown (01/15/2022)   Received from [facility]   Social Network    Social Network: Not on file   Additional Social History:                           Current Medications: Current Facility-Administered Medications  Medication Dose Route Frequency Provider Last Rate Last Admin   acetaminophen  (TYLENOL ) tablet 650 mg  650 mg Oral Q6H PRN Bobbitt, Shalon E, NP       alum & mag hydroxide-simeth (MAALOX/MYLANTA) 200-200-20 MG/5ML suspension 30 mL  30 mL Oral Q4H PRN Bobbitt, Shalon E, NP       haloperidol  (HALDOL ) tablet 5 mg  5 mg Oral TID PRN Bobbitt, Shalon E, NP       And   diphenhydrAMINE  (BENADRYL ) capsule 50 mg  50 mg Oral TID PRN Bobbitt, Shalon E, NP       haloperidol  lactate (HALDOL ) injection 5 mg  5 mg Intramuscular TID PRN Bobbitt, Shalon E, NP       And   diphenhydrAMINE  (BENADRYL ) injection 50 mg  50 mg Intramuscular TID PRN Bobbitt, Shalon E, NP       And   LORazepam  (ATIVAN ) injection 2 mg  2 mg Intramuscular TID PRN Bobbitt, Shalon E, NP       haloperidol  lactate (HALDOL ) injection 10 mg  10 mg Intramuscular TID PRN Bobbitt, Shalon E, NP       And   diphenhydrAMINE  (BENADRYL ) injection 50 mg  50 mg Intramuscular TID PRN Bobbitt, Shalon E, NP       And   LORazepam  (ATIVAN ) injection 2 mg  2 mg Intramuscular TID PRN Bobbitt, Shalon  E, NP       hydrOXYzine  (ATARAX ) tablet 25 mg  25 mg Oral TID PRN Hardin Hardenbrook H, NP       lurasidone  (LATUDA ) tablet 40 mg  40 mg Oral QHS Sterlin Knightly H, NP       magnesium  hydroxide (MILK OF MAGNESIA) suspension 30 mL  30 mL Oral Daily PRN Bobbitt, Shalon E, NP        Lab Results:  Results for orders placed or performed during the hospital encounter of 05/24/24 (from the past 48 hours)  Lipid panel     Status: Abnormal   Collection Time: 05/25/24  6:15 AM  Result Value Ref Range   Cholesterol 174 0 - 200 mg/dL    Comment:        ATP III CLASSIFICATION:  <200     mg/dL   Desirable  799-760  mg/dL   Borderline High  >=759  mg/dL   High           Triglycerides 85 <150 mg/dL   HDL 40 (L) >59 mg/dL   Total CHOL/HDL Ratio 4.4 RATIO   VLDL 17 0 - 40 mg/dL   LDL Cholesterol 882 (H) 0 - 99 mg/dL    Comment:        Total Cholesterol/HDL:CHD Risk Coronary Heart Disease Risk Table                     Men   Women  1/2 Average Risk   3.4   3.3  Average Risk       5.0   4.4  2 X Average Risk   9.6   7.1  3 X Average Risk  23.4   11.0        Use the calculated Patient Ratio above and the CHD Risk Table to determine the patient's CHD Risk.        ATP III CLASSIFICATION (LDL):  <100     mg/dL   Optimal  899-870  mg/dL   Near or Above                    Optimal  130-159  mg/dL   Borderline  839-810  mg/dL   High  >809     mg/dL   Very High Performed at [facility], [address] [place], [place] [address]     Blood Alcohol level:  Lab Results  Component Value Date   [facility] <15 05/22/2024   ETH <10 08/26/2023    Metabolic Disorder Labs: Lab Results  Component Value Date   HGBA1C 5.5 08/29/2023   MPG 111 08/29/2023   MPG 105.41 07/11/2022   No results found for: PROLACTIN Lab Results  Component Value Date   CHOL 174 05/25/2024   TRIG 85 05/25/2024   HDL 40 (L) 05/25/2024   CHOLHDL 4.4 05/25/2024   VLDL 17 05/25/2024   LDLCALC 117  (H) 05/25/2024   LDLCALC 70 08/29/2023    Physical Findings: AIMS:  ,  ,  ,  ,  ,  ,   CIWA:    COWS:     Musculoskeletal: Strength & Muscle Tone: within normal limits Gait & Station: normal Patient leans: N/A  Psychiatric Specialty Exam:  Presentation  General Appearance:  Appropriate for Environment  Eye Contact: Minimal  Speech: Clear and Coherent  Speech Volume: Normal  Handedness: Right   Mood and Affect  Mood: Depressed; Hopeless; Worthless  Affect: Solicitor Processes: Linear  Descriptions of Associations:Intact  Orientation:Full (Time, Place and Person)  Thought Content:Logical  History of Schizophrenia/Schizoaffective disorder:No data recorded Duration of Psychotic Symptoms:No data recorded Hallucinations:Hallucinations: None  Ideas of Reference:None  Suicidal Thoughts:Suicidal Thoughts: No  Homicidal Thoughts:Homicidal Thoughts: No   Sensorium  Memory: Immediate Fair; Recent Fair  Judgment: Poor  Insight: Poor   Executive Functions  Concentration: Fair  Attention Span: Fair  Recall: Fair  Fund of Knowledge: Poor  Language: Fair   Psychomotor Activity  Psychomotor Activity: Psychomotor Activity: Normal   Assets  Assets: Resilience   Sleep  Sleep: Sleep: Good    Physical Exam: Physical Exam Vitals and nursing note reviewed.  Constitutional:      General: He is not in acute distress.    Appearance: He is not ill-appearing.  HENT:     Mouth/Throat:     Pharynx: Oropharynx is clear.  Cardiovascular:  Rate and Rhythm: Normal rate.     Pulses: Normal pulses.  Pulmonary:     Effort: No respiratory distress.  Skin:    General: Skin is dry.  Neurological:     Mental Status: He is alert and oriented to person, place, and time.    Review of Systems  Constitutional: Negative.   HENT: Negative.    Respiratory: Negative.    Cardiovascular: Negative.    Gastrointestinal: Negative.   Skin: Negative.   Neurological: Negative.   Psychiatric/Behavioral:  Positive for depression and hallucinations. Negative for substance abuse and suicidal ideas. The patient is nervous/anxious and has insomnia.    Blood pressure 114/70, pulse 62, temperature 97.8 F (36.6 C), temperature source Oral, resp. rate 20, height 5' 11 (1.803 m), weight 79.1 kg, SpO2 98%. Body mass index is 24.32 kg/m.   Treatment Plan Summary: Daily contact with patient to assess and evaluate symptoms and progress in treatment and Medication management   # Bipolar, mixed  # GAD - Continue Latuda  40 mg daily at bedtime - BH Haldol  agitation protocol (see MAR) - Hydroxyzine  25 mg 3 times daily as needed, anxiety     Observation Level/Precautions:  15 minute checks   New lab orders reviewed  Lipid panel - HDL 40, LDL cholesterol 117, otherwise within normal limit  Hemoglobin A1c - within normal limits (5.2)   EKG - Sinus rhythm  QT QTc 414/428    Psychotherapy: Group therapies  Medications: As listed above  Consultations: As needed  Discharge Concerns: Safety, medication compliance  Estimated LOS: 4 to 7 days  Other: ACT team referral if appropriate      Safety and Monitoring:             --  Involuntary admission to inpatient psychiatric unit for safety, stabilization and treatment             -- Daily contact with patient to assess and evaluate symptoms and progress in treatment             -- Patient's case to be discussed in multi-disciplinary team meeting             -- Precautions: suicide, elopement, and assault              --  The risks/benefits/side-effects/alternatives to this medication were discussed in detail with the patient and time was given for questions. The patient consents to medication trial.  -- FDA             -- Metabolic profile and EKG monitoring obtained while on an atypical antipsychotic (BMI: Lipid Panel: HbgA1c: QTc:)              --  Encouraged patient to participate in unit milieu and in scheduled group therapies              -- Short Term Goals: Ability to identify changes in lifestyle to reduce recurrence of condition will improve             -- Long Term Goals: Improvement in symptoms so as ready for discharge                Discharge Planning:              -- Social work and case management to assist with discharge planning and identification of hospital follow-up needs prior to discharge             -- Estimated LOS: 4-7 days             --  Discharge Concerns: Need to establish a safety plan; Medication compliance and effectiveness             -- Discharge Goals: Return home with outpatient referrals for mental health follow-up including medication management/psychotherapy      I certify that inpatient services furnished can reasonably be expected to improve the patient's condition.    I personally spent a total of 45 minutes in the care of the patient today including preparing to see the patient, getting/reviewing separately obtained history, counseling and educating, documenting clinical information in the EHR, and coordinating care.    Blair Chiquita Hint, NP 05/25/2024, 8:35 AM

## 2024-05-25 NOTE — Plan of Care (Signed)
  Problem: Education: Goal: Emotional status will improve Outcome: Progressing Goal: Verbalization of understanding the information provided will improve Outcome: Progressing   Problem: Activity: Goal: Interest or engagement in activities will improve Outcome: Progressing   Problem: Education: Goal: Mental status will improve Outcome: Not Progressing

## 2024-05-25 NOTE — Progress Notes (Signed)
 Pt. Slept. 8.5 on night shift.

## 2024-05-26 ENCOUNTER — Other Ambulatory Visit (HOSPITAL_COMMUNITY): Payer: Self-pay

## 2024-05-26 ENCOUNTER — Telehealth (HOSPITAL_COMMUNITY): Payer: Self-pay | Admitting: Pharmacy Technician

## 2024-05-26 DIAGNOSIS — F3163 Bipolar disorder, current episode mixed, severe, without psychotic features: Principal | ICD-10-CM

## 2024-05-26 MED ORDER — SERTRALINE HCL 50 MG PO TABS: 50.0000 mg | ORAL_TABLET | Freq: Every day | ORAL | Status: DC

## 2024-05-26 MED ORDER — ARIPIPRAZOLE ER 400 MG IM SRER
400.0000 mg | INTRAMUSCULAR | Status: DC
  Administered 2024-05-28: 400 mg via INTRAMUSCULAR

## 2024-05-26 MED ORDER — ARIPIPRAZOLE 15 MG PO TABS
15.0000 mg | ORAL_TABLET | Freq: Every day | ORAL | Status: DC
  Administered 2024-05-27 – 2024-05-29 (×3): 15 mg via ORAL
  Filled 2024-05-26 (×3): qty 1

## 2024-05-26 MED ORDER — SERTRALINE HCL 25 MG PO TABS: 25.0000 mg | ORAL_TABLET | Freq: Once | ORAL | Status: DC

## 2024-05-26 MED ORDER — ARIPIPRAZOLE 10 MG PO TABS
10.0000 mg | ORAL_TABLET | Freq: Once | ORAL | Status: AC
  Administered 2024-05-26: 10 mg via ORAL
  Filled 2024-05-26: qty 1

## 2024-05-26 NOTE — Group Note (Signed)
 Recreation Therapy Group Note   Group Topic:Team Building  Group Date: 05/26/2024 Start Time: 0930 End Time: 1000 Facilitators: Amel Gianino-McCall, LRT,CTRS Location: [address] Dayroom   Group Topic: Communication, Team Building, Problem Solving  Goal Area(s) Addresses:  Patient will effectively work with peer towards shared goal.  Patient will identify skills used to make activity successful.  Patient will share challenges and verbalize solution-driven approaches used. Patient will identify how skills used during activity can be used to reach post d/c goals.   Behavioral Response:   Intervention: STEM Activity   Activity: [employer]. Patients were provided the following materials: 5 drinking straws, 5 rubber bands, 5 paper clips, 2 index cards, 2 drinking cups, and 2 toilet paper rolls. Using the provided materials patients were asked to build a launching mechanism to launch a ping pong ball across the room, approximately 10 feet. Patients were divided into teams of 3-5. Instructions required all materials be incorporated into the device, functionality of items left to the peer group's discretion.  Education: Pharmacist, community, Scientist, physiological, Air cabin crew, Building control surveyor.   Education Outcome: Acknowledges education/In group clarification offered/Needs additional education.    Affect/Mood: Flat   Participation Level: Minimal   Participation Quality: Independent   Behavior: On-looking   Speech/Thought Process: None   Insight: None   Judgement: None   Modes of Intervention: STEM Activity   Patient Response to Interventions:  Attentive   Education Outcome:  In group clarification offered    Clinical Observations/Individualized Feedback: Pt was quiet and observed peers as they worked on [employer]. Pt made an attempt at the end of group to launch the ball from their launcher.     Plan: Continue to engage patient in RT group sessions  2-3x/week.   [place] Sorn-McCall, LRT,CTRS 05/26/2024 11:40 AM

## 2024-05-26 NOTE — Progress Notes (Signed)
 Pt had torn off strings of gown and made a belt. Pt gave up belt when asked. Replaced with wristbands to hold up pants.  Charge nurse, [place], aware of situation.

## 2024-05-26 NOTE — [facility] Group Notes (Signed)
 Psychoeducational Group Note  Date:  05/26/2024 Time:  2125  Group Topic/Focus:  Recovery Goals:   The focus of this group is to identify appropriate goals for recovery and establish a plan to achieve them.  Participation Level: Did Not Attend  Participation Quality:  Not Applicable  Affect:  Not Applicable  Cognitive:  Not Applicable  Insight:  Not Applicable  Engagement in Group: Not Applicable  Additional Comments:  The patient did not attend the evening group.   Timothy Townsel S 05/26/2024, 9:25 PM

## 2024-05-26 NOTE — Plan of Care (Signed)
   Problem: Education: Goal: Emotional status will improve Outcome: Progressing   Problem: Activity: Goal: Interest or engagement in activities will improve Outcome: Progressing

## 2024-05-26 NOTE — Telephone Encounter (Signed)
 Patient Product/process development scientist completed.    The patient is insured through [employer].     Ran test claim for Abilify  Maintena 400 mg PRSY and the current 30 day co-pay is $4.00.   This test claim was processed through [facility]- copay amounts may vary at other pharmacies due to pharmacy/plan contracts, or as the patient moves through the different stages of their insurance plan.     Reyes Sharps, CPHT Pharmacy Technician III Certified Patient Advocate [facility] Patient Advocate Team Direct Number: [phone]  Fax: [phone]

## 2024-05-26 NOTE — Plan of Care (Signed)
  Problem: Education: Goal: Emotional status will improve Outcome: Progressing Goal: Mental status will improve Outcome: Progressing   Problem: Activity: Goal: Interest or engagement in activities will improve Outcome: Not Progressing   
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
## 2024-05-26 NOTE — Progress Notes (Signed)
   05/26/24 1400  Psych Admission Type (Psych Patients Only)  Admission Status Involuntary  Psychosocial Assessment  Patient Complaints Depression;Isolation  Eye Contact Brief  Facial Expression Flat  Affect Depressed  Speech Logical/coherent  Interaction Isolative;Minimal  Motor Activity Slow  Appearance/Hygiene Unremarkable  Behavior Characteristics Cooperative  Mood Depressed  Thought Process  Coherency WDL  Content WDL  Delusions None reported or observed  Perception WDL  Hallucination None reported or observed  Judgment Impaired  Confusion None  Danger to Self  Current suicidal ideation? Denies  Description of Suicide Plan No Plan  Agreement Not to Harm Self Yes  Description of Agreement Verbal  Danger to Others  Danger to Others None reported or observed

## 2024-05-26 NOTE — Progress Notes (Signed)
(  Sleep Hours) - 4  (Any PRNs that were needed, meds refused, or side effects to meds)- n/a  (Any disturbances and when (visitation, over night)- n/a (Concerns raised by the patient)- n/a  (SI/HI/AVH)- denies

## 2024-05-26 NOTE — Group Note (Signed)
 Date:  05/26/2024 Time:  4:31 PM  Group Topic/Focus:  Dimensions of Wellness: The focus of this group is to introduce the topic of wellness and discuss the role each dimension of wellness plays in total health.    Participation Level:  Active  Participation Quality:  Appropriate  Affect:  Appropriate  Cognitive:  Appropriate  Insight: Appropriate  Engagement in Group:  Engaged  Modes of Intervention:  Discussion   Shanda BIRCH Lorren Rossetti 05/26/2024, 4:31 PM

## 2024-05-27 NOTE — Group Note (Signed)
 Date:  05/27/2024 Time:  9:10 AM  Group Topic/Focus:  Goals Group:   The focus of this group is to help patients establish daily goals to achieve during treatment and discuss how the patient can incorporate goal setting into their daily lives to aide in recovery.    Participation Level:  Did Not Attend  Participation Quality:  na  Affect:  na  Cognitive:  na  Insight: None  Engagement in Group:  na  Modes of Intervention:  na  Additional Comments:  na  Nat Rummer 05/27/2024, 9:10 AM

## 2024-05-27 NOTE — Progress Notes (Signed)
 [facility] MD Progress Note  05/27/2024 2:58 PM Edward [name]  MRN:  [id number]  Principal Problem: Bipolar 1 disorder, depressed, severe (HCC) Diagnosis: Principal Problem:   Bipolar 1 disorder, depressed, severe (HCC)  Total Time spent with patient: 45 minutes  Reason for Admission:  Edward [name] is a 23 year old male with a psychiatric history significant for anxiety, attention deficit disorder, bipolar disorder, oppositional defiant disorder and major depressive disorder presented to [facility] via EMS after an intentional overdose in the context of familial conflict.  He reported ingesting approximately 8 tablets of his prescribed psychotropic medications: Sertraline  50 mg, Abilify  15 mg, and hydroxyzine  25 mg.  Patient reportedly self initiated the 911 call reporting overdose. Following medical stabilization and clearance, patient was admitted to the [facility].  Chart review indicates a recent psychiatric hospitalization, with discharge from the [facility] in December 2024.     24-hour chart review: Chart reviewed. Patient's case discussed in interdisciplinary team meeting.  Vital signs reviewed without critical value. No as needed psychotropic medication required overnight.  He slept a documented 4.0 hours.  He is adherent to taking psychotropic medication regimen. Nursing notes indicate no behavioral challenges on the unit.  Today's Assessment Notes:  Edward [name] was seen and examined in his room sitting up on his bed.  He presents alert, cooperative, and oriented to person, place, time, and situation.  Chart reviewed and findings shared with the treatment team and consults with attending psychiatrist with recommendation to continue his current treatment plan as already in progress.  Report compliance with his scheduled psychotropic medications of Abilify  15 mg p.o. daily and monthly Abilify  Maintena injection 400 mg.  Patient reports his mood is improving, and  rated depression as #0/10 and anxiety as #7/10, with 10 being high severity.  Encouraged patient to obtain PRNs for anxiety from nursing staff.  Reports his appetite and sleep are stable.  Nursing staff reports patient sleeping 6 hours overnight and being restful.  He does not appear to be responding to internal stimuli.  He denies delusional thinking or paranoia.  Further denies SI, HI, or AVH.  No changes in his treatment plan today, will follow-up with LCSW to consider ACT team referral as needed prior to discharge.  Past Psychiatric History: See H&P  Past Medical History:  Past Medical History:  Diagnosis Date   ADHD    Bipolar 1 disorder (HCC)    Intellectual developmental disorder, mild    ODD (oppositional defiant disorder)    Vitamin D  insufficiency 08/30/2023   History reviewed. No pertinent surgical history. Family History: History reviewed. No pertinent family history. Family Psychiatric  History: See H&P Social History:  Social History   Substance and Sexual Activity  Alcohol Use No     Social History   Substance and Sexual Activity  Drug Use No    Social History   Socioeconomic History   Marital status: Single    Spouse name: Not on file   Number of children: Not on file   Years of education: Not on file   Highest education level: Not on file  Occupational History   Not on file  Tobacco Use   Smoking status: Never   Smokeless tobacco: Never  Substance and Sexual Activity   Alcohol use: No   Drug use: No   Sexual activity: Not on file  Other Topics Concern   Not on file  Social History Narrative   Not on file   Social Drivers of Health  Financial Resource Strain: Not on file  Food Insecurity: Food Insecurity Present (05/24/2024)   Hunger Vital Sign    Worried About Running Out of Food in the Last Year: Sometimes true    Ran Out of Food in the Last Year: Sometimes true  Transportation Needs: No Transportation Needs (05/24/2024)   PRAPARE -  Administrator, Civil Service (Medical): No    Lack of Transportation (Non-Medical): No  Physical Activity: Not on file  Stress: Not on file  Social Connections: Unknown (01/15/2022)   Received from [facility]   Social Network    Social Network: Not on file   Additional Social History:    Current Medications: Current Facility-Administered Medications  Medication Dose Route Frequency Provider Last Rate Last Admin   acetaminophen  (TYLENOL ) tablet 650 mg  650 mg Oral Q6H PRN Bobbitt, Shalon E, NP       alum & mag hydroxide-simeth (MAALOX/MYLANTA) 200-200-20 MG/5ML suspension 30 mL  30 mL Oral Q4H PRN Bobbitt, Shalon E, NP       ARIPiprazole  (ABILIFY ) tablet 15 mg  15 mg Oral Daily Bennett, Christal H, NP   15 mg at 05/27/24 0759   [START ON 05/28/2024] ARIPiprazole  ER (ABILIFY  MAINTENA) injection 400 mg  400 mg Intramuscular Q28 days Bennett, Christal H, NP       haloperidol  (HALDOL ) tablet 5 mg  5 mg Oral TID PRN Bobbitt, Shalon E, NP       And   diphenhydrAMINE  (BENADRYL ) capsule 50 mg  50 mg Oral TID PRN Bobbitt, Shalon E, NP       haloperidol  lactate (HALDOL ) injection 5 mg  5 mg Intramuscular TID PRN Bobbitt, Shalon E, NP       And   diphenhydrAMINE  (BENADRYL ) injection 50 mg  50 mg Intramuscular TID PRN Bobbitt, Shalon E, NP       And   LORazepam  (ATIVAN ) injection 2 mg  2 mg Intramuscular TID PRN Bobbitt, Shalon E, NP       haloperidol  lactate (HALDOL ) injection 10 mg  10 mg Intramuscular TID PRN Bobbitt, Shalon E, NP       And   diphenhydrAMINE  (BENADRYL ) injection 50 mg  50 mg Intramuscular TID PRN Bobbitt, Shalon E, NP       And   LORazepam  (ATIVAN ) injection 2 mg  2 mg Intramuscular TID PRN Bobbitt, Shalon E, NP       hydrOXYzine  (ATARAX ) tablet 25 mg  25 mg Oral TID PRN Bennett, Christal H, NP   25 mg at 05/26/24 2053   magnesium  hydroxide (MILK OF MAGNESIA) suspension 30 mL  30 mL Oral Daily PRN Bobbitt, Shalon E, NP       Lab Results:  No results found for  this or any previous visit (from the past 48 hours).  Blood Alcohol level:  Lab Results  Component Value Date   [facility] <15 05/22/2024   ETH <10 08/26/2023   Metabolic Disorder Labs: Lab Results  Component Value Date   HGBA1C 5.2 05/25/2024   MPG 102.54 05/25/2024   MPG 111 08/29/2023   No results found for: PROLACTIN Lab Results  Component Value Date   CHOL 174 05/25/2024   TRIG 85 05/25/2024   HDL 40 (L) 05/25/2024   CHOLHDL 4.4 05/25/2024   VLDL 17 05/25/2024   LDLCALC 117 (H) 05/25/2024   LDLCALC 70 08/29/2023   Physical Findings: AIMS:  ,  ,  ,  ,  ,  ,   CIWA:  COWS:     Musculoskeletal: Strength & Muscle Tone: within normal limits Gait & Station: normal Patient leans: N/A  Psychiatric Specialty Exam:  Presentation  General Appearance:  Appropriate for Environment; Casual  Eye Contact: Fair  Speech: Clear and Coherent  Speech Volume: Normal  Handedness: Right  Mood and Affect  Mood: Depressed  Affect: Congruent  Thought Process  Thought Processes: Linear  Descriptions of Associations:Intact  Orientation:Full (Time, Place and Person)  Thought Content:Logical  History of Schizophrenia/Schizoaffective disorder:No data recorded Duration of Psychotic Symptoms:No data recorded Hallucinations:Hallucinations: None  Ideas of Reference:None  Suicidal Thoughts:Suicidal Thoughts: No  Homicidal Thoughts:Homicidal Thoughts: No  Sensorium  Memory: Immediate Fair; Recent Fair  Judgment: Fair  Insight: Shallow  Executive Functions  Concentration: Fair  Attention Span: Fair  Recall: Fair  Fund of Knowledge: Fair  Language: Fair  Psychomotor Activity  Psychomotor Activity: Psychomotor Activity: Normal  Assets  Assets: Physical Health; Resilience  Sleep  Sleep: Sleep: Good Number of Hours of Sleep: 6  Physical Exam: Physical Exam Vitals and nursing note reviewed.  Constitutional:      General: He is not in  acute distress.    Appearance: He is not ill-appearing.  HENT:     Head: Normocephalic.     Right Ear: External ear normal.     Left Ear: External ear normal.     Nose: Nose normal.     Mouth/Throat:     Mouth: Mucous membranes are moist.     Pharynx: Oropharynx is clear.  Eyes:     Extraocular Movements: Extraocular movements intact.  Cardiovascular:     Rate and Rhythm: Normal rate.     Pulses: Normal pulses.  Pulmonary:     Effort: Pulmonary effort is normal. No respiratory distress.  Abdominal:     Comments: Deferred  Genitourinary:    Comments: Deferred  Musculoskeletal:        General: Normal range of motion.     Cervical back: Normal range of motion.  Skin:    General: Skin is dry.  Neurological:     Mental Status: He is alert and oriented to person, place, and time.  Psychiatric:        Mood and Affect: Mood normal.        Behavior: Behavior normal.    Review of Systems  Constitutional:  Negative for chills and fever.  HENT:  Negative for sore throat.   Eyes:  Negative for blurred vision.  Respiratory:  Negative for cough, sputum production, shortness of breath and wheezing.   Cardiovascular:  Negative for chest pain and palpitations.  Gastrointestinal:  Negative for abdominal pain, constipation, diarrhea, heartburn, nausea and vomiting.  Genitourinary:  Negative for dysuria.  Musculoskeletal:  Negative for falls and myalgias.  Skin:  Negative for itching and rash.  Neurological:  Negative for dizziness and headaches.  Endo/Heme/Allergies:        See allergy listing  Psychiatric/Behavioral:  Positive for depression. Negative for hallucinations, substance abuse and suicidal ideas. The patient is nervous/anxious. The patient does not have insomnia.    Blood pressure 114/74, pulse 63, temperature 98.2 F (36.8 C), temperature source Oral, resp. rate 14, height 5' 11 (1.803 m), weight 79.1 kg, SpO2 99%. Body mass index is 24.32 kg/m.  Treatment Plan  Summary: Daily contact with patient to assess and evaluate symptoms and progress in treatment and Medication management  # Bipolar, mixed  # GAD - Discontinue Latuda  40 mg daily at bedtime - Completed Abilify  10 mg  x 1 dose 05/26/2024 - Continue Abilify  15 mg daily 05/27/2024  - Restart Abilify  Maintena injection 400 mg every 28 days on 05/28/2024 [facility] Haldol  agitation protocol (see MAR) - Hydroxyzine  25 mg 3 times daily as needed, anxiety     Observation Level/Precautions:  15 minute checks   New lab orders reviewed  Lipid panel - HDL 40, LDL cholesterol 117, otherwise within normal limit  Hemoglobin A1c - within normal limits (5.2)   EKG - Sinus rhythm  QT QTc 414/428    Psychotherapy: Group therapies  Medications: As listed above  Consultations: As needed  Discharge Concerns: Safety, medication compliance  Estimated LOS: 4 to 7 days  Other: ACT team referral if appropriate      Safety and Monitoring:             --  Involuntary admission to inpatient psychiatric unit for safety, stabilization and treatment             -- Daily contact with patient to assess and evaluate symptoms and progress in treatment             -- Patient's case to be discussed in multi-disciplinary team meeting             -- Precautions: suicide, elopement, and assault              --  The risks/benefits/side-effects/alternatives to this medication were discussed in detail with the patient and time was given for questions. The patient consents to medication trial.  -- FDA             -- Metabolic profile and EKG monitoring obtained while on an atypical antipsychotic (BMI: Lipid Panel: HbgA1c: QTc:)              -- Encouraged patient to participate in unit milieu and in scheduled group therapies              -- Short Term Goals: Ability to identify changes in lifestyle to reduce recurrence of condition will improve             -- Long Term Goals: Improvement in symptoms so as ready for discharge            Discharge Planning:              -- Social work and case management to assist with discharge planning and identification of hospital follow-up needs prior to discharge             -- Estimated LOS: 4-7 days             -- Discharge Concerns: Need to establish a safety plan; Medication compliance and effectiveness             -- Discharge Goals: Return home with outpatient referrals for mental health follow-up including medication management/psychotherapy      I certify that inpatient services furnished can reasonably be expected to improve the patient's condition.    Ellouise JAYSON Azure, FNP 05/27/2024, 2:58 PM Patient ID: Edward [name], male   DOB: February 04, 2001, 23 y.o.   MRN: [id number] Patient ID: Edward [name], male   DOB: 03-May-2001, 23 y.o.   MRN: [id number]

## 2024-05-27 NOTE — Plan of Care (Signed)
   Problem: Education: Goal: Emotional status will improve Outcome: Progressing Goal: Mental status will improve Outcome: Progressing Goal: Verbalization of understanding the information provided will improve Outcome: Progressing   Problem: Activity: Goal: Interest or engagement in activities will improve Outcome: Progressing

## 2024-05-27 NOTE — Progress Notes (Signed)
   05/27/24 1200  Psych Admission Type (Psych Patients Only)  Admission Status Involuntary  Psychosocial Assessment  Patient Complaints Sleep disturbance  Eye Contact Fair  Facial Expression Flat  Affect Depressed  Speech Logical/coherent  Interaction Minimal  Motor Activity Slow  Appearance/Hygiene Unremarkable  Behavior Characteristics Cooperative  Mood Depressed  Thought Process  Coherency WDL  Content WDL  Delusions None reported or observed  Perception WDL  Hallucination None reported or observed  Judgment Impaired  Confusion None  Danger to Self  Current suicidal ideation? Denies  Agreement Not to Harm Self Yes  Description of Agreement verbal  Danger to Others  Danger to Others None reported or observed

## 2024-05-27 NOTE — Progress Notes (Signed)
(  Sleep Hours) - 6 (Any PRNs that were needed, meds refused, or side effects to meds)- PRN vistaril  25 mg given at pt request, no meds refused.  (Any disturbances and when (visitation, over night)- None  (Concerns raised by the patient)- None  (SI/HI/AVH)- Denies SI/HI/AVH

## 2024-05-27 NOTE — BHH Group Notes (Signed)
 Adult Psychoeducational Group Note  Date:  05/27/2024 Time:  8:44 PM  Group Topic/Focus:  Wrap-Up Group:   The focus of this group is to help patients review their daily goal of treatment and discuss progress on daily workbooks.  Participation Level:  Active  Participation Quality:  Appropriate  Affect:  Appropriate  Cognitive:  Appropriate  Insight: Appropriate  Engagement in Group:  Engaged  Modes of Intervention:  Discussion and Support  Additional Comments:  Pt told that today was an okay day on the unit, though he mostly remained in his room. I want to sleep less during the day so that I can sleep better at night. On the subject of ways to stay well upon discharge, Pt mentioned wanting to start seeing a therapist on a regular basis. Pt rated his day a 4 out of 10.  Edward [name] 05/27/2024, 8:44 PM

## 2024-05-27 NOTE — Group Note (Signed)
 LCSW Group Therapy Note   Group Date: 05/27/2024 Start Time: 1100 End Time: 1200   Participation:  did not attend  Type of Therapy:  Group Therapy  Topic:  Healing Flames: Navigating Anger with Compassion  Objective:  Foster self-awareness and promote compassion toward oneself and others when dealing with anger.  Goals:  Help participants understand the underlying emotions and needs fueling anger. Provide coping strategies for healthier emotional expression and anger management.  Summary: This session explored anger as a volcano - an explosion driven by deeper feelings and unmet needs. Participants learned to identify anger triggers and underlying emotions, then practiced coping strategies like deep breathing, physical activity, and journaling. The group discussed healthy ways to manage anger before it escalates, using both personal reflection and shared experiences.  Therapeutic Modalities: Cognitive Behavioral Therapy (CBT): Challenging thoughts that fuel anger. Mindfulness: Increasing awareness of emotions and sensations.   Caylen Kuwahara O Norie Latendresse, LCSWA 05/27/2024  12:39 PM

## 2024-05-27 NOTE — Group Note (Signed)
 Recreation Therapy Group Note   Group Topic:Other  Group Date: 05/27/2024 Start Time: 1307 End Time: 1346 Facilitators: Nemiah Kissner-McCall, LRT,CTRS Location: [address] Dayroom   Activity Description/Intervention: Therapeutic Drumming. Patients with peers and staff were given the opportunity to engage in a leader facilitated [facility] Group Empowerment Drumming Circle with staff from the [employer], in partnership with [employer]. Teaching laboratory technician and trained [employer], Norleen Mon leading with LRT observing and documenting intervention and pt response. This evidenced-based practice targets 7 areas of health and wellbeing in the human experience including: stress-reduction, exercise, self-expression, camaraderie/support, nurturing, spirituality, and music-making (leisure).   Goal Area(s) Addresses:  Patient will engage in pro-social way in music group.  Patient will follow directions of drum leader on the first prompt. Patient will demonstrate no behavioral issues during group.  Patient will identify if a reduction in stress level occurs as a result of participation in therapeutic drum circle.    Education: Leisure exposure, Pharmacologist, Musical expression, Discharge Planning   Affect/Mood: N/A   Participation Level: Did not attend    Clinical Observations/Individualized Feedback:     Plan: Continue to engage patient in RT group sessions 2-3x/week.   Vamsi Apfel-McCall, LRT,CTRS 05/27/2024 2:06 PM

## 2024-05-27 NOTE — Plan of Care (Signed)

## 2024-05-28 ENCOUNTER — Encounter (HOSPITAL_COMMUNITY): Payer: Self-pay

## 2024-05-28 NOTE — Progress Notes (Signed)
(  Sleep Hours) - 6.75 (Any PRNs that were needed, meds refused, or side effects to meds)- PRN vistaril  25 mg given at pt request, no meds refused.  (Any disturbances and when (visitation, over night)- None  (Concerns raised by the patient)- None  (SI/HI/AVH)- Denies SI/HI/AVH

## 2024-05-28 NOTE — Progress Notes (Signed)
:   REFERRAL  CSW submitted referral for [facility]) on 05/28/24 @ 3:20 pm   Louetta Lame, LCSW-A

## 2024-05-28 NOTE — B[facility] Group Notes (Signed)
 [facility] Group Notes:  (Nursing/MHT/Case Management/Adjunct)  Date:  05/28/2024  Time:  8:33 PM  Type of Therapy:  Groups  Participation Level:  Did Not Attend  Participation Quality:    Affect:    Cognitive:    Insight:    Engagement in Group:    Modes of Intervention:    Summary of Progress/Problems: Refused to attend any groups.  Edward [name] 05/28/2024, 8:33 PM

## 2024-05-28 NOTE — Group Note (Deleted)
 Date:  05/28/2024 Time:  10:40 PM  Group Topic/Focus:  Emotional Education:   The focus of this group is to discuss what feelings/emotions are, and how they are experienced.     Participation Level:  {BHH PARTICIPATION OZCZO:77735}  Participation Quality:  {BHH PARTICIPATION QUALITY:22265}  Affect:  {BHH AFFECT:22266}  Cognitive:  {BHH COGNITIVE:22267}  Insight: {BHH Insight2:20797}  Engagement in Group:  {BHH ENGAGEMENT IN HMNLE:77731}  Modes of Intervention:  {BHH MODES OF INTERVENTION:22269}  Additional Comments:  ***  Edward [name] 05/28/2024, 10:40 PM

## 2024-05-28 NOTE — Progress Notes (Signed)
 [facility] MD Progress Note  05/28/2024 1:29 PM [name]  MRN:  [id number]  Principal Problem: Bipolar 1 disorder, depressed, severe (HCC) Diagnosis: Principal Problem:   Bipolar 1 disorder, depressed, severe (HCC)  Total Time spent with patient: 45 minutes  Reason for Admission:  Edward [name] is a 23 year old male with a psychiatric history significant for anxiety, attention deficit disorder, bipolar disorder, oppositional defiant disorder and major depressive disorder presented to [facility] via EMS after an intentional overdose in the context of familial conflict.  He reported ingesting approximately 8 tablets of his prescribed psychotropic medications: Sertraline  50 mg, Abilify  15 mg, and hydroxyzine  25 mg.  Patient reportedly self initiated the 911 call reporting overdose. Following medical stabilization and clearance, patient was admitted to the [facility].  Chart review indicates a recent psychiatric hospitalization, with discharge from the [facility] in December 2024.     24-hour chart review: Chart reviewed. Patient's case discussed in interdisciplinary team meeting.  Vital signs reviewed without critical value.  As needed of hydroxyzine  x 1 required.  No agitation protocol required he slept a documented 6.75 hours.  He is adherent to taking psychotropic medication regimen. Nursing notes indicate no behavioral challenges on the unit.  Today's Assessment Notes:  Edward [name] was seen and examined on the unit.  He presents alert, cooperative, and oriented to person, place, time, and situation.  Chart reviewed and findings shared with the treatment team and consults with attending psychiatrist with recommendation to continue his current treatment plan as already in progress. Patient received his Abilify  maintaina IM today at 8:00 in preparation for discharge.  Report compliance with his scheduled psychotropic medications of Abilify  15 mg p.o. daily and monthly  Abilify  Maintena injection 400 mg q. monthly.  Patient reports his mood is improving, and rated depression as #0/10 and anxiety as #5/10, with 10 being high severity.  Encouraged patient to obtain PRNs for anxiety from nursing staff.  Patient reports, I cannot wait to get out of here and go back to work, this is causing me a lot of anxiety staying here at the hospital. Reports he is eating well and sleep is stable.  Nursing staff reports patient sleeping 6.75 hours overnight and being restful.  He does not appear to be responding to internal stimuli.  He denies delusional thinking or paranoia.  Further denies SI, HI, or AVH.  No changes in his treatment plan today, will follow-up with LCSW to consider ACT team referral as needed prior to discharge.  Past Psychiatric History: See H&P  Past Medical History:  Past Medical History:  Diagnosis Date   ADHD    Bipolar 1 disorder (HCC)    Intellectual developmental disorder, mild    ODD (oppositional defiant disorder)    Vitamin D  insufficiency 08/30/2023   History reviewed. No pertinent surgical history. Family History: History reviewed. No pertinent family history. Family Psychiatric  History: See H&P Social History:  Social History   Substance and Sexual Activity  Alcohol Use No     Social History   Substance and Sexual Activity  Drug Use No    Social History   Socioeconomic History   Marital status: Single    Spouse name: Not on file   Number of children: Not on file   Years of education: Not on file   Highest education level: Not on file  Occupational History   Not on file  Tobacco Use   Smoking status: Never   Smokeless tobacco: Never  Substance and Sexual Activity   Alcohol use: No   Drug use: No   Sexual activity: Not on file  Other Topics Concern   Not on file  Social History Narrative   Not on file   Social Drivers of Health   Financial Resource Strain: Not on file  Food Insecurity: Food Insecurity Present  (05/24/2024)   Hunger Vital Sign    Worried About Running Out of Food in the Last Year: Sometimes true    Ran Out of Food in the Last Year: Sometimes true  Transportation Needs: No Transportation Needs (05/24/2024)   PRAPARE - Administrator, Civil Service (Medical): No    Lack of Transportation (Non-Medical): No  Physical Activity: Not on file  Stress: Not on file  Social Connections: Unknown (01/15/2022)   Received from [facility]   Social Network    Social Network: Not on file   Additional Social History:    Current Medications: Current Facility-Administered Medications  Medication Dose Route Frequency Provider Last Rate Last Admin   acetaminophen  (TYLENOL ) tablet 650 mg  650 mg Oral Q6H PRN Bobbitt, Shalon E, NP       alum & mag hydroxide-simeth (MAALOX/MYLANTA) 200-200-20 MG/5ML suspension 30 mL  30 mL Oral Q4H PRN Bobbitt, Shalon E, NP       ARIPiprazole  (ABILIFY ) tablet 15 mg  15 mg Oral Daily Bennett, Christal H, NP   15 mg at 05/28/24 9176   ARIPiprazole  ER (ABILIFY  MAINTENA) injection 400 mg  400 mg Intramuscular Q28 days Blair, Christal H, NP   400 mg at 05/28/24 9175   haloperidol  (HALDOL ) tablet 5 mg  5 mg Oral TID PRN Bobbitt, Shalon E, NP       And   diphenhydrAMINE  (BENADRYL ) capsule 50 mg  50 mg Oral TID PRN Bobbitt, Shalon E, NP       haloperidol  lactate (HALDOL ) injection 5 mg  5 mg Intramuscular TID PRN Bobbitt, Shalon E, NP       And   diphenhydrAMINE  (BENADRYL ) injection 50 mg  50 mg Intramuscular TID PRN Bobbitt, Shalon E, NP       And   LORazepam  (ATIVAN ) injection 2 mg  2 mg Intramuscular TID PRN Bobbitt, Shalon E, NP       haloperidol  lactate (HALDOL ) injection 10 mg  10 mg Intramuscular TID PRN Bobbitt, Shalon E, NP       And   diphenhydrAMINE  (BENADRYL ) injection 50 mg  50 mg Intramuscular TID PRN Bobbitt, Shalon E, NP       And   LORazepam  (ATIVAN ) injection 2 mg  2 mg Intramuscular TID PRN Bobbitt, Shalon E, NP       hydrOXYzine  (ATARAX )  tablet 25 mg  25 mg Oral TID PRN Bennett, Christal H, NP   25 mg at 05/27/24 2113   magnesium  hydroxide (MILK OF MAGNESIA) suspension 30 mL  30 mL Oral Daily PRN Bobbitt, Shalon E, NP       Lab Results:  No results found for this or any previous visit (from the past 48 hours).  Blood Alcohol level:  Lab Results  Component Value Date   [facility] <15 05/22/2024   ETH <10 08/26/2023   Metabolic Disorder Labs: Lab Results  Component Value Date   HGBA1C 5.2 05/25/2024   MPG 102.54 05/25/2024   MPG 111 08/29/2023   No results found for: PROLACTIN Lab Results  Component Value Date   CHOL 174 05/25/2024   TRIG 85 05/25/2024  HDL 40 (L) 05/25/2024   CHOLHDL 4.4 05/25/2024   VLDL 17 05/25/2024   LDLCALC 117 (H) 05/25/2024   LDLCALC 70 08/29/2023   Physical Findings: AIMS:  ,  ,  ,  ,  ,  ,   CIWA:    COWS:     Musculoskeletal: Strength & Muscle Tone: within normal limits Gait & Station: normal Patient leans: N/A  Psychiatric Specialty Exam:  Presentation  General Appearance:  Appropriate for Environment; Casual  Eye Contact: Fair  Speech: Clear and Coherent  Speech Volume: Normal  Handedness: Right  Mood and Affect  Mood: Depressed; Anxious  Affect: Congruent  Thought Process  Thought Processes: Linear  Descriptions of Associations:Intact  Orientation:Full (Time, Place and Person)  Thought Content:Logical  History of Schizophrenia/Schizoaffective disorder:No data recorded Duration of Psychotic Symptoms:No data recorded Hallucinations:Hallucinations: None  Ideas of Reference:None  Suicidal Thoughts:Suicidal Thoughts: No  Homicidal Thoughts:Homicidal Thoughts: No  Sensorium  Memory: Immediate Fair; Recent Fair  Judgment: Fair  Insight: Fair  Art therapist  Concentration: Fair  Attention Span: Fair  Recall: [employer] of Knowledge: Fair  Language: Fair  Psychomotor Activity  Psychomotor Activity: Psychomotor  Activity: Normal  Assets  Assets: Physical Health; Resilience; Communication Skills  Sleep  Sleep: Sleep: Good Number of Hours of Sleep: 6.75  Physical Exam: Physical Exam Vitals and nursing note reviewed.  Constitutional:      General: He is not in acute distress.    Appearance: He is not ill-appearing.  HENT:     Head: Normocephalic.     Right Ear: External ear normal.     Left Ear: External ear normal.     Nose: Nose normal.     Mouth/Throat:     Mouth: Mucous membranes are moist.     Pharynx: Oropharynx is clear.  Eyes:     Extraocular Movements: Extraocular movements intact.  Cardiovascular:     Rate and Rhythm: Normal rate.     Pulses: Normal pulses.  Pulmonary:     Effort: Pulmonary effort is normal. No respiratory distress.  Abdominal:     Comments: Deferred  Genitourinary:    Comments: Deferred  Musculoskeletal:        General: Normal range of motion.     Cervical back: Normal range of motion.  Skin:    General: Skin is dry.  Neurological:     Mental Status: He is alert and oriented to person, place, and time.  Psychiatric:        Mood and Affect: Mood normal.        Behavior: Behavior normal.    Review of Systems  Constitutional:  Negative for chills and fever.  HENT:  Negative for sore throat.   Eyes:  Negative for blurred vision.  Respiratory:  Negative for cough, sputum production, shortness of breath and wheezing.   Cardiovascular:  Negative for chest pain and palpitations.  Gastrointestinal:  Negative for abdominal pain, constipation, diarrhea, heartburn, nausea and vomiting.  Genitourinary:  Negative for dysuria.  Musculoskeletal:  Negative for falls and myalgias.  Skin:  Negative for itching and rash.  Neurological:  Negative for dizziness and headaches.  Endo/Heme/Allergies:        See allergy listing  Psychiatric/Behavioral:  Positive for depression. Negative for hallucinations, substance abuse and suicidal ideas. The patient is  nervous/anxious. The patient does not have insomnia.    Blood pressure 121/76, pulse (!) 53, temperature 97.9 F (36.6 C), temperature source Oral, resp. rate 14, height 5' 11 (1.803  m), weight 79.1 kg, SpO2 100%. Body mass index is 24.32 kg/m.  Treatment Plan Summary: Daily contact with patient to assess and evaluate symptoms and progress in treatment and Medication management  # Bipolar, mixed  # GAD - Discontinue Latuda  40 mg daily at bedtime - Completed Abilify  10 mg x 1 dose 05/26/2024 - Continue Abilify  15 mg daily 05/27/2024  - Restart Abilify  Maintena injection 400 mg every 28 days on 05/28/2024. Received today at 8:00 AM - BH Haldol  agitation protocol (see MAR) - Hydroxyzine  25 mg 3 times daily as needed, anxiety     Observation Level/Precautions:  15 minute checks   New lab orders reviewed  Lipid panel - HDL 40, LDL cholesterol 117, otherwise within normal limit  Hemoglobin A1c - within normal limits (5.2)   EKG - Sinus rhythm  QT QTc 414/428    Psychotherapy: Group therapies  Medications: As listed above  Consultations: As needed  Discharge Concerns: Safety, medication compliance  Estimated LOS: 4 to 7 days  Other: ACT team referral if appropriate      Safety and Monitoring:             --  Involuntary admission to inpatient psychiatric unit for safety, stabilization and treatment             -- Daily contact with patient to assess and evaluate symptoms and progress in treatment             -- Patient's case to be discussed in multi-disciplinary team meeting             -- Precautions: suicide, elopement, and assault              --  The risks/benefits/side-effects/alternatives to this medication were discussed in detail with the patient and time was given for questions. The patient consents to medication trial.  -- FDA             -- Metabolic profile and EKG monitoring obtained while on an atypical antipsychotic (BMI: Lipid Panel: HbgA1c: QTc:)              --  Encouraged patient to participate in unit milieu and in scheduled group therapies              -- Short Term Goals: Ability to identify changes in lifestyle to reduce recurrence of condition will improve             -- Long Term Goals: Improvement in symptoms so as ready for discharge           Discharge Planning:              -- Social work and case management to assist with discharge planning and identification of hospital follow-up needs prior to discharge             -- Estimated LOS: 4-7 days             -- Discharge Concerns: Need to establish a safety plan; Medication compliance and effectiveness             -- Discharge Goals: Return home with outpatient referrals for mental health follow-up including medication management/psychotherapy      I certify that inpatient services furnished can reasonably be expected to improve the patient's condition.    Ellouise JAYSON Azure, FNP 05/28/2024, 1:29 PM Patient ID: Edward [name], male   DOB: Apr 30, 2001, 23 y.o.   MRN: [id number] Patient ID: Kharon Hixon, male   DOB: 2000-09-30, 22  y.o.   MRN: [id number] Patient ID: Rainen Vanrossum, male   DOB: 11-Apr-2001, 23 y.o.   MRN: [id number]

## 2024-05-28 NOTE — Progress Notes (Signed)
 D: Patient is alert, oriented, and cooperative. Denies SI, HI, AVH, and verbally contracts for safety. Patient reports he slept fair last night with sleeping medication. Patient reports his appetite as good, energy level as normal, and concentration as good. Patient rates his depression 0/10, hopelessness 0/10, and anxiety 5/10. Patient denies physical symptoms/pain.    A: Scheduled medications administered per MD order. Patient received LAI injection this morning. Support provided. Patient educated on safety on the unit and medications. Routine safety checks every 15 minutes. Patient stated understanding to tell nurse about any new physical symptoms. Patient understands to tell staff of any needs.     R: No adverse drug reactions noted. Patient remains safe at this time and will continue to monitor.    05/28/24 1000  Psych Admission Type (Psych Patients Only)  Admission Status Involuntary  Psychosocial Assessment  Patient Complaints None  Eye Contact Fair  Facial Expression Flat  Affect Depressed  Speech Logical/coherent  Interaction Minimal  Motor Activity Slow  Appearance/Hygiene Unremarkable  Behavior Characteristics Cooperative;Calm  Mood Depressed  Thought Process  Coherency WDL  Content WDL  Delusions None reported or observed  Perception WDL  Hallucination None reported or observed  Judgment Impaired  Confusion None  Danger to Self  Current suicidal ideation? Denies  Agreement Not to Harm Self Yes  Description of Agreement verbal  Danger to Others  Danger to Others None reported or observed

## 2024-05-28 NOTE — Group Note (Signed)
 Recreation Therapy Group Note   Group Topic:Leisure Education  Group Date: 05/28/2024 Start Time: 0935 End Time: 1003 Facilitators: Feleica Fulmore-McCall, LRT,CTRS Location: [address] Dayroom   Group Topic: Leisure Education  Goal Area(s) Addresses:  Patient will identify positive leisure activities.  Patient will identify one positive benefit of participation in leisure activities.   Behavioral Response:   Intervention: Leisure Group Game  Activity: Patient, MHT, and LRT participated in playing a trivia game of Guess the [place]. Participants took turns flicking the spinner. Whatever category (R&B, Hip Hop, Pop, Indie, Dance and Rock) the spinner landed on, the person would be read the lyrics and the person would have guess the missing lyric. The person with the most cards at the end of game won.  Education:  Leisure Exposure, Pharmacist, community, Discharge Planning  Education Outcome: Acknowledges education/In group clarification offered/Needs additional education   Affect/Mood: N/A   Participation Level: Did not attend    Clinical Observations/Individualized Feedback:     Plan: Continue to engage patient in RT group sessions 2-3x/week.   Edward [name], LRT,CTRS  05/28/2024 12:10 PM

## 2024-05-28 NOTE — Plan of Care (Signed)

## 2024-05-28 NOTE — Group Note (Signed)
 Date:  05/28/2024 Time:  8:56 AM  Group Topic/Focus:  Goals Group:   The focus of this group is to help patients establish daily goals to achieve during treatment and discuss how the patient can incorporate goal setting into their daily lives to aide in recovery. Patients were asked two questions: What is something you can do today to take care of your mental health? What is 1 thing you're proud of doing lately?    Participation Level:  Did Not Attend  Participation Quality:  N/A  Affect:  N/A  Cognitive:  N/A  Insight: None  Engagement in Group:  N/A  Modes of Intervention:  N/A  Additional Comments:  Patient did not attend goals group.  Kristi HERO Kamy Poinsett 05/28/2024, 8:56 AM

## 2024-05-28 NOTE — BH IP Treatment Plan (Signed)
 Interdisciplinary Treatment and Diagnostic Plan Update  05/28/2024 Time of Session: 12:00 PM - UPDATE Edward [name] MRN: [id number]  Principal Diagnosis: Bipolar 1 disorder, depressed, severe (HCC)  Secondary Diagnoses: Principal Problem:   Bipolar 1 disorder, depressed, severe (HCC)   Current Medications:  Current Facility-Administered Medications  Medication Dose Route Frequency Provider Last Rate Last Admin   acetaminophen  (TYLENOL ) tablet 650 mg  650 mg Oral Q6H PRN Bobbitt, Shalon E, NP       alum & mag hydroxide-simeth (MAALOX/MYLANTA) 200-200-20 MG/5ML suspension 30 mL  30 mL Oral Q4H PRN Bobbitt, Shalon E, NP       ARIPiprazole  (ABILIFY ) tablet 15 mg  15 mg Oral Daily Bennett, Christal H, NP   15 mg at 05/28/24 9176   ARIPiprazole  ER (ABILIFY  MAINTENA) injection 400 mg  400 mg Intramuscular Q28 days Blair, Christal H, NP   400 mg at 05/28/24 9175   haloperidol  (HALDOL ) tablet 5 mg  5 mg Oral TID PRN Bobbitt, Shalon E, NP       And   diphenhydrAMINE  (BENADRYL ) capsule 50 mg  50 mg Oral TID PRN Bobbitt, Shalon E, NP       haloperidol  lactate (HALDOL ) injection 5 mg  5 mg Intramuscular TID PRN Bobbitt, Shalon E, NP       And   diphenhydrAMINE  (BENADRYL ) injection 50 mg  50 mg Intramuscular TID PRN Bobbitt, Shalon E, NP       And   LORazepam  (ATIVAN ) injection 2 mg  2 mg Intramuscular TID PRN Bobbitt, Shalon E, NP       haloperidol  lactate (HALDOL ) injection 10 mg  10 mg Intramuscular TID PRN Bobbitt, Shalon E, NP       And   diphenhydrAMINE  (BENADRYL ) injection 50 mg  50 mg Intramuscular TID PRN Bobbitt, Shalon E, NP       And   LORazepam  (ATIVAN ) injection 2 mg  2 mg Intramuscular TID PRN Bobbitt, Shalon E, NP       hydrOXYzine  (ATARAX ) tablet 25 mg  25 mg Oral TID PRN Bennett, Christal H, NP   25 mg at 05/27/24 2113   magnesium  hydroxide (MILK OF MAGNESIA) suspension 30 mL  30 mL Oral Daily PRN Bobbitt, Shalon E, NP       PTA Medications: Medications Prior to Admission   Medication Sig Dispense Refill Last Dose/Taking   ARIPiprazole  (ABILIFY ) 15 MG tablet Take 1 tablet (15 mg total) by mouth daily. (Patient not taking: Reported on 05/23/2024) 30 tablet 2    hydrOXYzine  (ATARAX ) 25 MG tablet Take 1 tablet (25 mg total) by mouth 3 (three) times daily as needed for anxiety. 90 tablet 5    sertraline  (ZOLOFT ) 50 MG tablet Take 1 tablet (50 mg total) by mouth daily. (Patient not taking: Reported on 05/23/2024) 90 tablet 2     Patient Stressors:    Patient Strengths:    Treatment Modalities: Medication Management, Group therapy, Case management,  1 to 1 session with clinician, Psychoeducation, Recreational therapy.   Physician Treatment Plan for Primary Diagnosis: Bipolar 1 disorder, depressed, severe (HCC) Long Term Goal(s): Improvement in symptoms so as ready for discharge   Short Term Goals: Ability to identify changes in lifestyle to reduce recurrence of condition will improve  Medication Management: Evaluate patient's response, side effects, and tolerance of medication regimen.  Therapeutic Interventions: 1 to 1 sessions, Unit Group sessions and Medication administration.  Evaluation of Outcomes: Progressing  Physician Treatment Plan for Secondary Diagnosis: Principal Problem:   Bipolar  1 disorder, depressed, severe (HCC)  Long Term Goal(s): Improvement in symptoms so as ready for discharge   Short Term Goals: Ability to identify changes in lifestyle to reduce recurrence of condition will improve     Medication Management: Evaluate patient's response, side effects, and tolerance of medication regimen.  Therapeutic Interventions: 1 to 1 sessions, Unit Group sessions and Medication administration.  Evaluation of Outcomes: Progressing   RN Treatment Plan for Primary Diagnosis: Bipolar 1 disorder, depressed, severe (HCC) Long Term Goal(s): Knowledge of disease and therapeutic regimen to maintain health will improve  Short Term Goals: Ability to  remain free from injury will improve, Ability to verbalize frustration and anger appropriately will improve, Ability to verbalize feelings will improve, and Ability to disclose and discuss suicidal ideas  Medication Management: RN will administer medications as ordered by provider, will assess and evaluate patient's response and provide education to patient for prescribed medication. RN will report any adverse and/or side effects to prescribing provider.  Therapeutic Interventions: 1 on 1 counseling sessions, Psychoeducation, Medication administration, Evaluate responses to treatment, Monitor vital signs and CBGs as ordered, Perform/monitor CIWA, COWS, AIMS and Fall Risk screenings as ordered, Perform wound care treatments as ordered.  Evaluation of Outcomes: Progressing   LCSW Treatment Plan for Primary Diagnosis: Bipolar 1 disorder, depressed, severe (HCC) Long Term Goal(s): Safe transition to appropriate next level of care at discharge, Engage patient in therapeutic group addressing interpersonal concerns.  Short Term Goals: Engage patient in aftercare planning with referrals and resources, Increase ability to appropriately verbalize feelings, Facilitate acceptance of mental health diagnosis and concerns, and Identify triggers associated with mental health/substance abuse issues  Therapeutic Interventions: Assess for all discharge needs, 1 to 1 time with Social worker, Explore available resources and support systems, Assess for adequacy in community support network, Educate family and significant other(s) on suicide prevention, Complete Psychosocial Assessment, Interpersonal group therapy.  Evaluation of Outcomes: Progressing   Progress in Treatment: Attending groups: attended some groups Participating in groups: Yes Taking medication as prescribed: Yes. Toleration medication: Yes. Family/Significant other contact made: Yes, contacted Armando Cherry fayed (mother) [phone]  Discussing  patient identified problems/goals with staff: Yes. Medical problems stabilized or resolved: Yes. Denies suicidal/homicidal ideation: Yes. Issues/concerns per patient self-inventory: No.     Patient Goals:  Not being hopeless   Discharge Plan or Barriers:  Patient recently admitted. CSW will continue to follow and assess for appropriate referrals and possible discharge planning.     Reason for Continuation of Hospitalization: Medication stabilization Withdrawal symptoms   Estimated Length of Stay: 1 - 2 days  Last 3 [place] Suicide Severity Risk Score: Flowsheet Row [facility] (Current[facility]/22[facility]2024 in [facility] from [facility]/19/2024 in [facility]  C-SSRS RISK CATEGORY High Risk High Risk Moderate Risk    Last PHQ 2/9 Scores:    03/19/2024    3:49 PM 03/16/2024    1:42 PM 01/22/2024   10:27 AM  Depression screen PHQ 2/9  Decreased Interest 3 2 2   Down, Depressed, Hopeless 3 3 3   PHQ - 2 Score 6 5 5   Altered sleeping 3 3 3   Tired, decreased energy 3 2 2   Change in appetite 1 2 2   Feeling bad or failure about yourself  2 3 3   Trouble concentrating 3 3 3   Moving slowly or fidgety/restless 2 1 1   Suicidal thoughts 3  3 3  PHQ-9 Score 23 22 22   Difficult doing work/chores Very difficult Very difficult Very difficult    Scribe for Treatment Team: Kennan Detter O Kerri Kovacik, LCSWA 05/28/2024 2:30 PM

## 2024-05-29 DIAGNOSIS — F314 Bipolar disorder, current episode depressed, severe, without psychotic features: Secondary | ICD-10-CM

## 2024-05-29 MED ORDER — ARIPIPRAZOLE ER 400 MG IM SRER: 400.0000 mg | INTRAMUSCULAR | Status: DC

## 2024-05-29 MED ORDER — ARIPIPRAZOLE 15 MG PO TABS: 15.0000 mg | ORAL_TABLET | Freq: Every day | ORAL | 0 refills | Status: DC

## 2024-05-29 NOTE — Progress Notes (Addendum)
  [facility] Adult Case Management Discharge Plan :  Will you be returning to the same living situation after discharge:  Yes,  patient to return home.  At discharge, do you have transportation home?: Yes,  SW to assist with transportation. **Patient states he has ability to pay for transportation, Gisele stated ok to use by on-call supervisor Inocente SAUNDERS. After difficulty contacting [employer]  Do you have the ability to pay for your medications: Yes,  patient has insurance.   Release of information consent forms completed and in the chart;  Patient's signature needed at discharge.  Patient to Follow up at:  Follow-up Information     [facility], [facility]. Go on 06/03/2024.   Why: You have an appointment for medication management services on 06/03/24 at 10:00 am .  The appointment will be held in person.  * Please bring a photo ID, and your insurance card, and social security card, if available. Contact information: [address] [place] [address] [phone]         [facility]. Go on 06/04/2024.   Why: You have an appointment for family counseling services on 06/04/24 at 5:00 pm.  (There is also a [facility] location) Contact information: [address], [place], [place] [address] Phone [phone] Fax [phone]        [facility] Call.   Why: A referral has been made to this provider for ACTT services.  Please call after discharge to follow up. Contact information: [address] [place] [address] [phone]                 Next level of care provider has access to [facility] Link:no  Safety Planning and Suicide Prevention discussed: Yes, SPE completed with Armando Cherry fayed (mother) [phone].   Has patient been referred to the Quitline?: Patient refused referral for treatment  Patient has been referred for addiction treatment: Patient refused referral for treatment.  Hunter SAUNDERS Lever, LCSWA 05/29/2024, 9:17  AM

## 2024-05-29 NOTE — Group Note (Signed)
 Date:  05/29/2024 Time:  10:53 AM  Group Topic/Focus:  Goals Group:   The focus of this group is to help patients establish daily goals to achieve during treatment and discuss how the patient can incorporate goal setting into their daily lives to aide in recovery.    Participation Level:  Did Not Attend  Participation Quality:  N/A  Affect:  N/A  Cognitive:  N/A  Insight: None  Engagement in Group:  None  Modes of Intervention:  N/A  Additional Comments:  Patient declined attending goals group.  Kristi HERO Osric Klopf 05/29/2024, 10:53 AM

## 2024-05-29 NOTE — Progress Notes (Signed)
(  Sleep Hours) - 7.5 (Any PRNs that were needed, meds refused, or side effects to meds)- PRN Hydroxyzine   (Any disturbances and when (visitation, over night)- None (Concerns raised by the patient)- None (SI/HI/AVH)- Denies, contracts for safety

## 2024-05-29 NOTE — [facility]HH Suicide Risk Assessment (Signed)
 [facility] Discharge Suicide Risk Assessment   Principal Problem: Bipolar 1 disorder, depressed, severe (HCC) Discharge Diagnoses: Principal Problem:   Bipolar 1 disorder, depressed, severe (HCC)   Demographic Factors:  Male and Adolescent or young adult  Loss Factors: NA  Historical Factors: Family history of mental illness or substance abuse and Impulsivity  Risk Reduction Factors:   Living with another person, especially a relative, Positive social support, and Positive therapeutic relationship  Continued Clinical Symptoms:  Bipolar Disorder  Cognitive Features That Contribute To Risk:  Limited insight and judgement    Suicide Risk:  Mild:  Suicidal ideation of limited frequency, intensity, duration, and specificity.  There are no identifiable plans, no associated intent, mild dysphoria and related symptoms, good self-control (both objective and subjective assessment), few other risk factors, and identifiable protective factors, including available and accessible social support.   Follow-up Information     [facility]. Go on 06/03/2024.   Why: You have an appointment for medication management services on 06/03/24 at 10:00 am .  The appointment will be held in person.  * Please bring a photo ID, and your insurance card, and social security card, if available. Contact information: [address] [place] [address] [phone]         [facility]. Go on 06/04/2024.   Why: You have an appointment for family counseling services on 06/04/24 at 5:00 pm.  (There is also a [facility] location) Contact information: [address], [place], [place] [address] Phone [phone] Fax [phone]        [facility] Call.   Why: A referral has been made to this provider for ACTT services.  Please call after discharge to follow up. Contact information: [address] [place] [address] [phone]                   Oliva DELENA Salmon, DO 05/29/2024, 9:04 AM

## 2024-05-29 NOTE — Plan of Care (Signed)
   Problem: Safety: Goal: Ability to disclose and discuss suicidal ideas will improve Outcome: Progressing

## 2024-05-29 NOTE — Plan of Care (Signed)
°  Problem: Education: °Goal: Knowledge of [facility] Education information/materials will improve °Outcome: Adequate for Discharge °Goal: Emotional status will improve °Outcome: Adequate for Discharge °Goal: Mental status will improve °Outcome: Adequate for Discharge °Goal: Verbalization of understanding the information provided will improve °Outcome: Adequate for Discharge °  °Problem: Activity: °Goal: Interest or engagement in activities will improve °Outcome: Adequate for Discharge °Goal: Sleeping patterns will improve °Outcome: Adequate for Discharge °  °Problem: Coping: °Goal: Ability to verbalize frustrations and anger appropriately will improve °Outcome: Adequate for Discharge °Goal: Ability to demonstrate self-control will improve °Outcome: Adequate for Discharge °  °Problem: Health Behavior/Discharge Planning: °Goal: Identification of resources available to assist in meeting health care needs will improve °Outcome: Adequate for Discharge °Goal: Compliance with treatment plan for underlying cause of condition will improve °Outcome: Adequate for Discharge °  °Problem: Physical Regulation: °Goal: Ability to maintain clinical measurements within normal limits will improve °Outcome: Adequate for Discharge °  °Problem: Safety: °Goal: Periods of time without injury will increase °Outcome: Adequate for Discharge °  °Problem: Education: °Goal: Ability to make informed decisions regarding treatment will improve °Outcome: Adequate for Discharge °  °Problem: Coping: °Goal: Coping ability will improve °Outcome: Adequate for Discharge °  °Problem: Health Behavior/Discharge Planning: °Goal: Identification of resources available to assist in meeting health care needs will improve °Outcome: Adequate for Discharge °  °Problem: Medication: °Goal: Compliance with prescribed medication regimen will improve °Outcome: Adequate for Discharge °  °Problem: Self-Concept: °Goal: Ability to disclose and discuss suicidal ideas  will improve °Outcome: Adequate for Discharge °Goal: Will verbalize positive feelings about self °Outcome: Adequate for Discharge °  °Problem: Education: °Goal: Utilization of techniques to improve thought processes will improve °Outcome: Adequate for Discharge °Goal: Knowledge of the prescribed therapeutic regimen will improve °Outcome: Adequate for Discharge °  °Problem: Activity: °Goal: Interest or engagement in leisure activities will improve °Outcome: Adequate for Discharge °Goal: Imbalance in normal sleep/wake cycle will improve °Outcome: Adequate for Discharge °  °Problem: Coping: °Goal: Coping ability will improve °Outcome: Adequate for Discharge °Goal: Will verbalize feelings °Outcome: Adequate for Discharge °  °Problem: Health Behavior/Discharge Planning: °Goal: Ability to make decisions will improve °Outcome: Adequate for Discharge °Goal: Compliance with therapeutic regimen will improve °Outcome: Adequate for Discharge °  °Problem: Role Relationship: °Goal: Will demonstrate positive changes in social behaviors and relationships °Outcome: Adequate for Discharge °  °Problem: Safety: °Goal: Ability to disclose and discuss suicidal ideas will improve °Outcome: Adequate for Discharge °Goal: Ability to identify and utilize support systems that promote safety will improve °Outcome: Adequate for Discharge °  °Problem: Self-Concept: °Goal: Will verbalize positive feelings about self °Outcome: Adequate for Discharge °Goal: Level of anxiety will decrease °Outcome: Adequate for Discharge °  °Problem: Education: °Goal: Ability to state activities that reduce stress will improve °Outcome: Adequate for Discharge °  °Problem: Coping: °Goal: Ability to identify and develop effective coping behavior will improve °Outcome: Adequate for Discharge °  °Problem: Self-Concept: °Goal: Ability to identify factors that promote anxiety will improve °Outcome: Adequate for Discharge °Goal: Level of anxiety will decrease °Outcome:  Adequate for Discharge °Goal: Ability to modify response to factors that promote anxiety will improve °Outcome: Adequate for Discharge °  °

## 2024-05-29 NOTE — Progress Notes (Signed)
 Patient ID: Edward [name], male   DOB: [birth year], 23 y.o.   MRN: [id number]  Patient discharged home via [facility]. Satisfied with belongings returned. Discharge paperwork supplied with education. Verbalized understanding. Denies SI/HI/AVH. No distress noted.

## 2024-05-30 NOTE — Discharge Summary (Signed)
 Physician Discharge Summary Note  Patient:  Edward [name] is an 23 y.o., male MRN:  [id number] DOB:  [birth year] Patient phone:  [phone] (home)  Patient address:   [address]  [place] [address],  Total Time spent with patient: 45 minutes  Date of Admission:  05/24/2024 Date of Discharge: 05/29/2024  Reason for Admission:  Edward [name] is a 23 year old male with a history of bipolar disorder, anxiety, ADHD, ODD, and mild intellectual disability who was admitted involuntarily following an intentional overdose of his prescribed psychotropic medications (sertraline , aripiprazole , hydroxyzine ) in the context of acute familial conflict. The patient self-initiated a 911 call after the overdose. He was medically stabilized in the ED and subsequently transferred for psychiatric stabilization.  Principal Problem: Bipolar 1 disorder, depressed, severe (HCC) Discharge Diagnoses: Principal Problem:   Bipolar 1 disorder, depressed, severe (HCC)   Past Psychiatric History: Patient reports one prior psychiatric hospitalization "here a few months ago" for severe depression and anxiety; he states this is his second hospitalization. He reports previously seeing a psychiatric provider at [facility] but did not follow up due to transportation issues and did not attend outpatient appointments after discharge. He recalls seeing therapy approximately five years ago while in independent living in [place]. He denies any prior suicide attempts and describes this as the first time he acted on suicidal thoughts. He denies past rehabilitation treatment, seizure history, significant head trauma, or concussions.   Past Medical History:  Past Medical History:  Diagnosis Date   ADHD    Bipolar 1 disorder (HCC)    Intellectual developmental disorder, mild    ODD (oppositional defiant disorder)    Vitamin D  insufficiency 08/30/2023   History reviewed. No pertinent surgical history.  Hospital  Course:  On arrival, the patient endorsed significant depressive symptoms, including hopelessness, worthlessness, anhedonia, hypersomnia, poor concentration, guilt, and crying spells, as well as anxiety characterized by excessive worry and restlessness. He denied current suicidal or homicidal ideation, hallucinations, or delusions, but described ongoing stressors related to strained family relationships and financial difficulties. He also reported poor adherence to his outpatient medication regimen prior to admission.  He was initially started on lurasidone , however, this was later changed to aripiprazole  due to his history of medication nonadherence and his expressed preference for a long-acting injectable. He received a single dose of Abilify  10 mg PO, which was then increased to 15 mg PO daily, and ultimately received Abilify  Maintena 400 mg IM on 05/28/2024 to provide long-acting coverage.   Throughout his hospitalization, [name] participated in group therapy sessions, though his engagement was initially limited to listening rather than active participation.  He reported improvement in mood and anxiety over the course of his stay. He consistently denied suicidal or homicidal ideation, hallucinations, or delusions during daily assessments.  Outpatient referrals for therapy and psychiatry were initiated, including a referral to the ACT team and [facility] in [place]. He reported that he would be returning to his mother's home upon discharge and had access to transportation and medications.  By the end of his hospitalization, [name]'s mood and anxiety symptoms had improved significantly. He was adherent to his medication regimen, denied any ongoing suicidal or homicidal ideation, and expressed readiness to return home and resume work.    Musculoskeletal: Normal gait and station  Mental Status Exam: Appearance - Casually dressed, appropriate hygiene and grooming  Attitude - Calm,  polite, not guarded Speech - normal volume, prosody, little inflection Mood - Fine Affect - Blunted Thought Process - Linear, GD Thought  Content - No delusional TC expressed SI/HI - Denies  Perceptions - Denies AVH; not RIS Judgement/Insight - Fair Fund of knowledge - WNL Language - No impairments   Physical Exam Vitals and nursing note reviewed.  HENT:     Head: Normocephalic and atraumatic.  Eyes:     Extraocular Movements: Extraocular movements intact.  Pulmonary:     Effort: Pulmonary effort is normal.  Musculoskeletal:        General: Normal range of motion.  Neurological:     General: No focal deficit present.     Mental Status: He is oriented to person, place, and time.    Review of Systems  Constitutional: Negative.   Respiratory: Negative.    Cardiovascular: Negative.    Blood pressure 110/71, pulse 65, temperature (!) 97.4 F (36.3 C), temperature source Oral, resp. rate 16, height 5' 11 (1.803 m), weight 79.1 kg, SpO2 100%. Body mass index is 24.32 kg/m.   Social History   Tobacco Use  Smoking Status Never  Smokeless Tobacco Never   Tobacco Cessation:  N/A, patient does not currently use tobacco products   Blood Alcohol level:  Lab Results  Component Value Date   [facility] <15 05/22/2024   ETH <10 08/26/2023    Metabolic Disorder Labs:  Lab Results  Component Value Date   HGBA1C 5.2 05/25/2024   MPG 102.54 05/25/2024   MPG 111 08/29/2023   No results found for: PROLACTIN Lab Results  Component Value Date   CHOL 174 05/25/2024   TRIG 85 05/25/2024   HDL 40 (L) 05/25/2024   CHOLHDL 4.4 05/25/2024   VLDL 17 05/25/2024   LDLCALC 117 (H) 05/25/2024   LDLCALC 70 08/29/2023    See Psychiatric Specialty Exam and Suicide Risk Assessment completed by Attending Physician prior to discharge.  Discharge destination:  Home  Is patient on multiple antipsychotic therapies at discharge:  No    Allergies as of 05/29/2024   No Known Allergies       Medication List     STOP taking these medications    hydrOXYzine  25 MG tablet Commonly known as: ATARAX    sertraline  50 MG tablet Commonly known as: ZOLOFT        TAKE these medications      Indication  ARIPiprazole  15 MG tablet Commonly known as: ABILIFY  Take 1 tablet (15 mg total) by mouth daily for 14 days. What changed: Another medication with the same name was added. Make sure you understand how and when to take each.  Indication: MIXED BIPOLAR AFFECTIVE DISORDER   ARIPiprazole  ER 400 MG Srer injection Commonly known as: ABILIFY  MAINTENA Inject 2 mLs (400 mg total) into the muscle every 28 (twenty-eight) days. Start taking on: June 24, 2024 What changed: You were already taking a medication with the same name, and this prescription was added. Make sure you understand how and when to take each.  Indication: Manic-Depression        Follow-up Information     [facility]. Go on 06/03/2024.   Why: You have an appointment for medication management services on 06/03/24 at 10:00 am .  The appointment will be held in person.  * Please bring a photo ID, and your insurance card, and social security card, if available. Contact information: [address] [place] [address] [phone]         [facility]. Go on 06/04/2024.   Why: You have an appointment for family counseling services on 06/04/24 at 5:00 pm.  (  There is also a [facility] location) Contact information: [address] [place], [place] [address] Phone [phone] Fax [phone]        [facility] Call.   Why: A referral has been made to this provider for ACTT services.  Please call after discharge to follow up. Contact information: [address] [place] [address] [phone]                 Follow-up recommendations:  Activity:  As tolerated Diet:  Regular   Signed: Oliva DELENA Salmon, DO 05/30/2024, 12:44  PM

## 2024-06-16 ENCOUNTER — Ambulatory Visit: Payer: MEDICAID

## 2024-06-16 VITALS — BP 119/72 | HR 67 | Ht 71.0 in | Wt 187.1 lb

## 2024-06-16 DIAGNOSIS — F314 Bipolar disorder, current episode depressed, severe, without psychotic features: Secondary | ICD-10-CM | POA: Diagnosis not present

## 2024-06-16 DIAGNOSIS — Z23 Encounter for immunization: Secondary | ICD-10-CM | POA: Diagnosis not present

## 2024-06-16 NOTE — Progress Notes (Signed)
 Established Patient Office Visit  Subjective   Patient ID: Edward [name], male    DOB: 09/13/2000  Age: 23 y.o. MRN: [id number]  Chief Complaint  Patient presents with   Medical Management of Chronic Issues    3 month follow up    HPI Discussed the use of AI scribe software for clinical note transcription with the patient, who gave verbal consent to proceed.  History of Present Illness   Edward [name] is a 23 year old male who presents for a follow-up after hospitalization.  Recent psychiatric hospitalization and medication management - Recently discharged from a behavioral hospital - Started on Abilify  15 mg as sole psychiatric medication - Zoloft  and hydroxyzine  discontinued - Uncertain about where to receive scheduled Abilify  injection every 28 days - Uncertain about status of Abilify  refills - No issues with other medication refills  Psychosocial stressors - Experiencing stress at home without further elaboration on specific stressors  Preventive care and logistics - Received recent influenza vaccination - Has reliable transportation for appointments      Patient Active Problem List   Diagnosis Date Noted   Bipolar disorder (HCC) 05/24/2024   Bipolar 1 disorder, depressed, severe (HCC) 04/25/2024   Aggressive behavior     ROS    Objective:     BP 119/72   Pulse 67   Ht 5' 11 (1.803 m)   Wt 187 lb 1.3 oz (84.9 kg)   SpO2 95%   BMI 26.09 kg/m  BP Readings from Last 3 Encounters:  06/16/24 119/72  05/23/24 111/75  03/16/24 112/72   Wt Readings from Last 3 Encounters:  06/16/24 187 lb 1.3 oz (84.9 kg)  05/22/24 174 lb 2.6 oz (79 kg)  03/16/24 173 lb 0.6 oz (78.5 kg)     Physical Exam Vitals and nursing note reviewed.  Constitutional:      Appearance: Normal appearance.  HENT:     Head: Normocephalic.     Right Ear: Tympanic membrane, ear canal and external ear normal.     Left Ear: Tympanic membrane, ear canal and external  ear normal.     Nose: Nose normal.     Mouth/Throat:     Mouth: Mucous membranes are moist.     Pharynx: Oropharynx is clear.  Eyes:     Extraocular Movements: Extraocular movements intact.     Pupils: Pupils are equal, round, and reactive to light.  Cardiovascular:     Rate and Rhythm: Normal rate and regular rhythm.  Pulmonary:     Effort: Pulmonary effort is normal.     Breath sounds: Normal breath sounds.  Abdominal:     General: Bowel sounds are normal.     Palpations: Abdomen is soft.  Musculoskeletal:     Cervical back: Normal range of motion and neck supple.  Skin:    General: Skin is warm and dry.  Neurological:     Mental Status: He is alert and oriented to person, place, and time.  Psychiatric:        Mood and Affect: Mood normal. Affect is flat.        Speech: Speech is delayed.        Behavior: Behavior normal.        Thought Content: Thought content normal.      No results found for any visits on 06/16/24.    The ASCVD Risk score (Arnett DK, et al., 2019) failed to calculate for the following reasons:   The 2019 ASCVD  risk score is only valid for ages 21 to 20    Assessment & Plan:   Problem List Items Addressed This Visit       Other   Bipolar 1 disorder, depressed, severe (HCC) - Primary   Managed with Abilify  15 mg post-discharge. Discontinued Zoloft  and hydroxyzine . Receives Abilify  injection every 28 days at [facility]. Uncertainty about injection location. - Confirm appointment details with [facility] for tomorrow. - Discuss medication refills and injection logistics with [facility]. - Continue Abilify  15 mg as prescribed. - Return for follow-up in four months.      Other Visit Diagnoses       Immunization due       Relevant Orders   Flu vaccine trivalent PF, 6mos and older(Flulaval,Afluria,Fluarix,Fluzone) (Completed)     Assessment and Plan           Return in about 4 months (around 10/17/2024) for chronic follow-up with PCP.    Leita Longs, FNP

## 2024-06-22 NOTE — Assessment & Plan Note (Signed)
 Managed with Abilify  15 mg post-discharge. Discontinued Zoloft  and hydroxyzine . Receives Abilify  injection every 28 days at [facility]. Uncertainty about injection location. - Confirm appointment details with [facility] for tomorrow. - Discuss medication refills and injection logistics with [facility]. - Continue Abilify  15 mg as prescribed. - Return for follow-up in four months.

## 2024-08-14 ENCOUNTER — Emergency Department (HOSPITAL_COMMUNITY)
Admission: EM | Admit: 2024-08-14 | Discharge: 2024-08-16 | Disposition: A | Payer: MEDICAID | Attending: Emergency Medicine | Admitting: Emergency Medicine

## 2024-08-14 ENCOUNTER — Other Ambulatory Visit: Payer: Self-pay

## 2024-08-14 ENCOUNTER — Encounter (HOSPITAL_COMMUNITY): Payer: Self-pay | Admitting: Emergency Medicine

## 2024-08-14 DIAGNOSIS — R7309 Other abnormal glucose: Secondary | ICD-10-CM | POA: Diagnosis not present

## 2024-08-14 DIAGNOSIS — F314 Bipolar disorder, current episode depressed, severe, without psychotic features: Secondary | ICD-10-CM | POA: Diagnosis present

## 2024-08-14 DIAGNOSIS — E876 Hypokalemia: Secondary | ICD-10-CM

## 2024-08-14 DIAGNOSIS — R45851 Suicidal ideations: Secondary | ICD-10-CM

## 2024-08-14 DIAGNOSIS — R739 Hyperglycemia, unspecified: Secondary | ICD-10-CM

## 2024-08-14 DIAGNOSIS — R17 Unspecified jaundice: Secondary | ICD-10-CM

## 2024-08-14 LAB — CBC WITH DIFFERENTIAL/PLATELET
Abs Immature Granulocytes: 0 K/uL (ref 0.00–0.07)
Basophils Absolute: 0.1 K/uL (ref 0.0–0.1)
Basophils Relative: 1 %
Eosinophils Absolute: 0.4 K/uL (ref 0.0–0.5)
Eosinophils Relative: 6 %
HCT: 45.1 % (ref 39.0–52.0)
Hemoglobin: 15.2 g/dL (ref 13.0–17.0)
Immature Granulocytes: 0 %
Lymphocytes Relative: 42 %
Lymphs Abs: 2.7 K/uL (ref 0.7–4.0)
MCH: 28.4 pg (ref 26.0–34.0)
MCHC: 33.7 g/dL (ref 30.0–36.0)
MCV: 84.3 fL (ref 80.0–100.0)
Monocytes Absolute: 0.8 K/uL (ref 0.1–1.0)
Monocytes Relative: 12 %
Neutro Abs: 2.5 K/uL (ref 1.7–7.7)
Neutrophils Relative %: 39 %
Platelets: 261 K/uL (ref 150–400)
RBC: 5.35 MIL/uL (ref 4.22–5.81)
RDW: 14.5 % (ref 11.5–15.5)
WBC: 6.4 K/uL (ref 4.0–10.5)
nRBC: 0 % (ref 0.0–0.2)

## 2024-08-14 MED ORDER — ZIPRASIDONE MESYLATE 20 MG IM SOLR: 20.0000 mg | Freq: Once | INTRAMUSCULAR | Status: AC

## 2024-08-14 MED ORDER — ZIPRASIDONE MESYLATE 20 MG IM SOLR: 20.0000 mg | Freq: Once | INTRAMUSCULAR | Status: DC

## 2024-08-14 MED ORDER — ZIPRASIDONE MESYLATE 20 MG IM SOLR
INTRAMUSCULAR | Status: AC
  Administered 2024-08-14: 20 mg via INTRAMUSCULAR
  Filled 2024-08-14: qty 20

## 2024-08-14 NOTE — ED Notes (Signed)
 Patient now resting comfortably. Officers removing cuffs. RR 14.
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
## 2024-08-14 NOTE — ED Provider Notes (Signed)
°  [place] EMERGENCY DEPARTMENT AT [facility] Provider Note   CSN: [id number] Arrival date & time: 08/14/24  2258     Patient presents with: Suicidal   Edward [name] is a 23 y.o. male.  {Add pertinent medical, surgical, social history, OB history to YEP:67052} The history is provided by the EMS personnel. The history is limited by the condition of the patient (Psychiatric disorder).   He has history of attention deficit disorder, oppositional defiant disorder, bipolar disorder and was brought in by EMS because of report of suicidal threat.  Reportedly, he texted his therapist saying he did not want to live anymore and he was going to take an overdose of his medications.  He arrived combative and not able to engage.    Prior to Admission medications  Medication Sig Start Date End Date Taking? Authorizing Provider  ARIPiprazole  (ABILIFY ) 15 MG tablet Take 1 tablet (15 mg total) by mouth daily for 14 days. 05/29/24 06/16/24  Prentis Oliva LABOR, DO  ARIPiprazole  ER (ABILIFY  MAINTENA) 400 MG SRER injection Inject 2 mLs (400 mg total) into the muscle every 28 (twenty-eight) days. 06/24/24   Prentis Oliva LABOR, DO    Allergies: Patient has no known allergies.    Review of Systems  Unable to perform ROS: Psychiatric disorder    Updated Vital Signs There were no vitals taken for this visit.  Physical Exam Vitals and nursing note reviewed.   23 year old male, combative and mildly diaphoretic. Vital signs are ***. Oxygen saturation is ***%, which is normal. Head is normocephalic and atraumatic. PERRLA, EOMI.  Lungs are clear without rales, wheezes, or rhonchi. Heart has regular rate and rhythm without murmur. Extremities have no obvious swelling or deformity. Skin is warm and dry without rash. Neurologic: Awake and agitated and combative, moves all extremities equally.  (all labs ordered are listed, but only abnormal results are displayed) Labs Reviewed - No data to  display  EKG: None  Radiology: No results found.  {Document cardiac monitor, telemetry assessment procedure when appropriate:32947} Procedures   Medications Ordered in the ED - No data to display    {Click here for ABCD2, HEART and other calculators REFRESH Note before signing:1}                              Medical Decision Making  Apparent suicidal ideation, patient combative, and I am unable to discuss anything with him.  He required restraint both for patient safety and staff safety.  I ordered a dose of ziprasidone .  I have reviewed his past records, and note ED visit on 05/22/2024 for intentional drug overdose followed by psychiatric hospitalization.  {Document critical care time when appropriate  Document review of labs and clinical decision tools ie CHADS2VASC2, etc  Document your independent review of radiology images and any outside records  Document your discussion with family members, caretakers and with consultants  Document social determinants of health affecting pt's care  Document your decision making why or why not admission, treatments were needed:32947:::1}   Final diagnoses:  None    ED Discharge Orders     None

## 2024-08-14 NOTE — Progress Notes (Signed)
 Pt cuffed to bed. Rocking back and fourth attempting to get out. Screaming & inconsolable.

## 2024-08-14 NOTE — ED Triage Notes (Signed)
 Pt BIB [facility] from home. Pt combative with [employer] on scene and arrival to ED. Pt attempting to bite and kick at [facility]. Pt had text his therapist saying that he didn't want to live any more and he was going to take all of his home medications and kill himself.

## 2024-08-15 DIAGNOSIS — F314 Bipolar disorder, current episode depressed, severe, without psychotic features: Secondary | ICD-10-CM

## 2024-08-15 LAB — URINE DRUG SCREEN
Amphetamines: NEGATIVE
Barbiturates: NEGATIVE
Benzodiazepines: NEGATIVE
Cocaine: NEGATIVE
Fentanyl: NEGATIVE
Methadone Scn, Ur: NEGATIVE
Opiates: NEGATIVE
Tetrahydrocannabinol: NEGATIVE

## 2024-08-15 LAB — COMPREHENSIVE METABOLIC PANEL WITH GFR
ALT: 25 U/L (ref 0–44)
AST: 30 U/L (ref 15–41)
Albumin: 4.7 g/dL (ref 3.5–5.0)
Alkaline Phosphatase: 55 U/L (ref 38–126)
Anion gap: 17 — ABNORMAL HIGH (ref 5–15)
BUN: 11 mg/dL (ref 6–20)
CO2: 23 mmol/L (ref 22–32)
Calcium: 9.6 mg/dL (ref 8.9–10.3)
Chloride: 100 mmol/L (ref 98–111)
Creatinine, Ser: 1.2 mg/dL (ref 0.61–1.24)
GFR, Estimated: >60 mL/min (ref >60)
Glucose, Bld: 127 mg/dL — ABNORMAL HIGH (ref 70–99)
Potassium: 3 mmol/L — ABNORMAL LOW (ref 3.5–5.1)
Sodium: 140 mmol/L (ref 135–145)
Total Bilirubin: 1.3 mg/dL — ABNORMAL HIGH (ref 0.0–1.2)
Total Protein: 7.8 g/dL (ref 6.5–8.1)

## 2024-08-15 LAB — ETHANOL: Alcohol, Ethyl (B): <15 mg/dL (ref <15)

## 2024-08-15 MED ORDER — ZIPRASIDONE MESYLATE 20 MG IM SOLR: 10.0000 mg | Freq: Once | INTRAMUSCULAR | Status: DC

## 2024-08-15 MED ORDER — ARIPIPRAZOLE 5 MG PO TABS
15.0000 mg | ORAL_TABLET | Freq: Every day | ORAL | Status: DC
  Administered 2024-08-15 – 2024-08-16 (×2): 15 mg via ORAL
  Filled 2024-08-15 (×2): qty 3

## 2024-08-15 MED ORDER — LORAZEPAM 2 MG/ML IJ SOLN: 1.0000 mg | Freq: Once | INTRAMUSCULAR | Status: DC

## 2024-08-15 MED ORDER — ZIPRASIDONE MESYLATE 20 MG IM SOLR
20.0000 mg | Freq: Two times a day (BID) | INTRAMUSCULAR | Status: DC | PRN
  Administered 2024-08-15: 20 mg via INTRAMUSCULAR
  Filled 2024-08-15: qty 20

## 2024-08-15 MED ORDER — STERILE WATER FOR INJECTION IJ SOLN
INTRAMUSCULAR | Status: AC
  Administered 2024-08-15: 10 mL
  Filled 2024-08-15: qty 10

## 2024-08-15 MED ORDER — POTASSIUM CHLORIDE CRYS ER 20 MEQ PO TBCR
40.0000 meq | EXTENDED_RELEASE_TABLET | ORAL | Status: AC
  Administered 2024-08-15 (×2): 40 meq via ORAL
  Filled 2024-08-15 (×2): qty 2

## 2024-08-15 MED ORDER — ONDANSETRON HCL 4 MG PO TABS: 4.0000 mg | ORAL_TABLET | Freq: Three times a day (TID) | ORAL | Status: DC | PRN

## 2024-08-15 MED ORDER — ALUM & MAG HYDROXIDE-SIMETH 200-200-20 MG/5ML PO SUSP: 30.0000 mL | Freq: Four times a day (QID) | ORAL | Status: DC | PRN

## 2024-08-15 MED ORDER — ACETAMINOPHEN 325 MG PO TABS: 650.0000 mg | ORAL_TABLET | ORAL | Status: DC | PRN

## 2024-08-15 NOTE — BH Assessment (Signed)
 IRIS consult entered to assist with assessment

## 2024-08-15 NOTE — ED Notes (Signed)
 Pt dressed in burgundy scrubs, belongings placed in locker 8

## 2024-08-15 NOTE — Progress Notes (Signed)
 CSW spoke with the Intake RN at [facility], who confirmed the patient has been declined due to high acuity. Brittany states due to the patient biting and kicking the facility has to decline.    Ketzaly Cardella, MSW, LCSW-A  4:11 PM 08/15/2024

## 2024-08-15 NOTE — ED Notes (Addendum)
 Pt speaking to Wyline Pizza, NP (TTS) on tele-cart in room with sitter present.

## 2024-08-15 NOTE — BH Assessment (Signed)
 TTS attempted to see pt. Per Hailey, RN, pt is too somnolent for an assessment. Pt was aggressive towards officers upon arrival and received Geodon  at 2315. TTS will be notified once pt is awake and alert.

## 2024-08-15 NOTE — Consult Note (Signed)
 [facility] Psychiatric Consult Initial  Patient Name: .Edward [name]  MRN: [id number]  DOB: [birth year]  Consult Order details:  Orders (From admission, onward)     Start     Ordered   08/15/24 0050  CONSULT TO CALL ACT TEAM       Ordering Provider: Raford Lenis, MD  Provider:  (Not yet assigned)  Question:  Reason for Consult?  Answer:  Psych consult   08/15/24 0051             Mode of Visit: In person    Psychiatry Consult Evaluation  Service Date: August 15, 2024 LOS:  LOS: 0 days  Chief Complaint Patient sent a text message to his therapist saying he didn't want to live anymore and was going to take all his medication and kill himself.  Primary Psychiatric Diagnoses  Bipolar 1 disorder, depressed severe  Assessment  Edward [name] is a 23 y.o. male admitted: Presented to the ED on 08/14/2024 10:59 PM for sending a text message to his therapist saying he didn't want to live anymore and was going to take all his medication and kill himself.   He carries the psychiatric diagnoses of Bipolar 1 depressed, anxiety, ADHD, ODD and MDD and has no reported past medical history.  His current presentation of suicidal thoughts, and depressive symptoms is most consistent with bipolar 1 disorder, depressed severe. He meets criteria for inpatient psychiatric treatment based on active suicidal thoughts and worsening depressive symptoms. Current outpatient psychotropic medications include Abilify  15 mg daily and historically he has had a positive response to these medications. He was noncompliant with medications prior to admission as evidenced by reporting he has not taken his medications in the past month.   On initial examination, patient is seated in no acute distress. He is alert and oriented x 4. His though process is linear. Thought content is positive for SI and negative for HI/AVH. Objectively, no signs of acute psychosis. His mood is depressed and affect is congruent. His speech is  clear at a decreased tone. He has minimum eye contact. He appears disheveled. He is calm and cooperative.   Please see plan below for detailed recommendations.   Diagnoses:  Active Hospital problems: Principal Problem:   Suicidal ideation Active Problems:   Bipolar 1 disorder, depressed, severe (HCC)    Plan   ## Psychiatric Medication Recommendations:  Continue Abilify  15 mg p.o. daily   Medical Decision Making Capacity: Not specifically addressed in this encounter   Further Work-up:  -- Add EKG -- most recent EKG on 05/22/24 had QtC of 412 -- Pertinent labwork reviewed earlier this admission includes: CMP, CBC, EKG, UDS, BAL   ## Disposition:-- We recommend inpatient psychiatric hospitalization after medical hospitalization. Patient has been involuntarily committed on 08/15/24.   ## Behavioral / Environmental: -Recommend using specific terminology regarding PNES, i.e. call the episodes non-epileptic seizures rather than pseudoseizures as the latter insinuates fake or feigned symptoms, when the events are a very real experience to the patient and are a physical, non-volitional, manifestation of fear, pain and anxiety. , To minimize splitting of staff, assign one staff person to communicate all information from the team when feasible., or Utilize compassion and acknowledge the patient's experiences while setting clear and realistic expectations for care.    ## Safety and Observation Level:  - Based on my clinical evaluation, I estimate the patient to be at high risk of self harm in the current setting. - At this time, we recommend  1:1 Observation. This decision is based on my review of the chart including patient's history and current presentation, interview of the patient, mental status examination, and consideration of suicide risk including evaluating suicidal ideation, plan, intent, suicidal or self-harm behaviors, risk factors, and protective factors. This judgment is  based on our ability to directly address suicide risk, implement suicide prevention strategies, and develop a safety plan while the patient is in the clinical setting. Please contact our team if there is a concern that risk level has changed.  CSSR Risk Category:C-SSRS RISK CATEGORY: High Risk  Suicide Risk Assessment: Patient has following modifiable risk factors for suicide: active suicidal ideation, untreated depression, medication noncompliance, current symptoms: anxiety/panic, insomnia, impulsivity, anhedonia, hopelessness, and triggering events, which we are addressing by recommending inpatient psychiatric treatment. Patient has following non-modifiable or demographic risk factors for suicide: male gender and psychiatric hospitalization Patient has the following protective factors against suicide: Supportive family  Thank you for this consult request. Recommendations have been communicated to the primary team.  We will continue to follow until inpatient psychiatric placement is obtained at this time.   Teresa Wyline CROME, NP       History of Present Illness  Relevant Aspects of Hospital ED Course:   Admitted on 08/14/2024 for sending a text message to his therapist saying he didn't want to live anymore and was going to take all his medication and kill himself.  Patient Report:  Patient states that he is in the hospital because his therapist called the police because he has been going through severe depression and experiencing a life crisis and told his therapist that he was feeling suicidal.  He states that he told his therapist he was in too much pain and felt like overdosing. He reports feeling suicidal for the past 2 days. He reports 2 past suicide attempts, by overdosing a few months ago and lying down in the street earlier this year. He denies self injurious behaviors. He denies HI.   He reports feeling depressed, endorses depressive symptoms of sadness, hopelessness, worthlessness,  decreased energy, decreased motivation, crying episodes, isolating, and anhedonia. He reports sleeping too much. He reports a good appetite. He denies auditory or visual hallucinations. No paranoia.  He identifies current stressors as recent break-up with his girlfriend, not having a job and feeling isolated. He states that he feels like everything is building up and he does not know how to get help.  He reports outpatient psychiatry with [facility]. He states that he was prescribed Abilify  15 mg  but stopped taking his medications 1 month ago after he ran out. He states that he was previously given an injection a few months ago while he was at the [facility]. He reports weekly therapy on Wednesdays. However, he is unable to remember his therapist name or the agency.  He denies drinking alcohol or using illicit drugs. He resides with his mother and 2 siblings. He denies legal issues. He is unemployed.  Psych ROS:  Depression: Yes Psychosis: (lifetime and current): No   Review of Systems  Respiratory: Negative.    Cardiovascular: Negative.   Psychiatric/Behavioral:  Positive for depression and suicidal ideas.      Psychiatric and Social History  Psychiatric History:  Information collected from the patient and the EMR  Prev Dx/Sx:Bipolar 1 depressed, anxiety, ADHD, ODD and MDD Current Psych Provider[facility] Home Meds (current): Abilify  15 mg  Therapy: Yes  Prior Psych Hospitalization: [facility] x 2 (2025) Prior Self  Harm: No  Family Psych History: Brother history of schizophrenia    Social History:  Occupational Hx: No Legal Hx: No Living Situation: Resides with mother and two older siblings.   Access to weapons/lethal means: No  Substance History Alcohol: No Illicit drugs: No   Exam Findings  Physical Exam:  Vital Signs:  Temp:  [97.6 F (36.4 C)-98.2 F (36.8 C)] 98.2 F (36.8 C) (12/14 1126) Pulse Rate:  [67-75] 75 (12/14 1126) Resp:  [16] 16  (12/14 1126) BP: (113-123)/(68-76) 113/76 (12/14 1126) SpO2:  [98 %] 98 % (12/14 1126) Weight:  [84.9 kg] 84.9 kg (12/13 2308) Blood pressure 113/76, pulse 75, temperature 98.2 F (36.8 C), resp. rate 16, height 5' 11 (1.803 m), weight 84.9 kg, SpO2 98%. Body mass index is 26.11 kg/m.  Physical Exam Cardiovascular:     Rate and Rhythm: Normal rate.  Pulmonary:     Effort: Pulmonary effort is normal.  Musculoskeletal:        General: Normal range of motion.  Neurological:     Mental Status: He is alert and oriented to person, place, and time.     Mental Status Exam: General Appearance: Disheveled  Orientation:  Full (Time, Place, and Person)  Memory:  Immediate;   Fair Recent;   Fair Remote;   Fair  Concentration:  Concentration: Fair  Recall:  Fair  Attention  Fair  Eye Contact:  Minimal  Speech:  Slow  Language:  Fair  Volume:  Decreased  Mood: depressed   Affect:  Congruent  Thought Process:  Coherent  Thought Content:  Logical  Suicidal Thoughts:  Yes.  with intent/plan  Homicidal Thoughts:  No  Judgement:  Poor  Insight:  Lacking  Psychomotor Activity:  Restlessness  Akathisia:  No  Fund of Knowledge:  Fair      Assets:  Manufacturing Systems Engineer Desire for Improvement Housing Social Support  Cognition:  WNL  ADL's:  Intact  AIMS (if indicated):        Other History   These have been pulled in through the EMR, reviewed, and updated if appropriate.  Family History:  The patient's family history is not on file.  Medical History: Past Medical History:  Diagnosis Date   ADHD    Bipolar 1 disorder (HCC)    Intellectual developmental disorder, mild    ODD (oppositional defiant disorder)    Vitamin D  insufficiency 08/30/2023    Surgical History: History reviewed. No pertinent surgical history.   Medications:  Current Medications[1]  Allergies: Allergies[2]  Karrina Lye L, NP     [1]  Current Facility-Administered Medications:     acetaminophen  (TYLENOL ) tablet 650 mg, 650 mg, Oral, Q4H PRN, Raford Lenis, MD   alum & mag hydroxide-simeth (MAALOX/MYLANTA) 200-200-20 MG/5ML suspension 30 mL, 30 mL, Oral, Q6H PRN, Raford Lenis, MD   ARIPiprazole  (ABILIFY ) tablet 15 mg, 15 mg, Oral, Daily, Raford Lenis, MD, 15 mg at 08/15/24 9178   ondansetron  (ZOFRAN ) tablet 4 mg, 4 mg, Oral, Q8H PRN, Raford Lenis, MD  Current Outpatient Medications:    ARIPiprazole  (ABILIFY ) 15 MG tablet, Take 1 tablet (15 mg total) by mouth daily for 14 days., Disp: 14 tablet, Rfl: 0   ARIPiprazole  ER (ABILIFY  MAINTENA) 400 MG SRER injection, Inject 2 mLs (400 mg total) into the muscle every 28 (twenty-eight) days., Disp: , Rfl:  [2] No Known Allergies

## 2024-08-15 NOTE — ED Notes (Signed)
 Patient asleep. Covers provided. RR 15, unlabored. Pt still refusing care at this time.

## 2024-08-15 NOTE — ED Notes (Signed)
 CN put TTS machine in room as requested and then staff called back and stated pt needed an IRIS first before TTS.

## 2024-08-15 NOTE — Progress Notes (Signed)
 Patient has been denied by [facility] due to no appropriate beds available. Patient meets BH inpatient criteria per Wyline Pizza, NP. Patient has been faxed out to the following facilities:   [facility]  [address] [place] [address] [phone]  [facility]  [address] [place] [address] [phone]  [facility]  [address] [place] [address] [phone]  [facility]  [address] [place], [place] [address] [phone]  [facility] Patient Pl[facility]  [facility], [place] [phone]  [facility]  [address] [place] [address] [phone]  [facility]  [address], [place] [address] [phone]  [facility] EFAX  [address], [place] [phone]  [facility]  [address], [place] [address] [phone]  [facility] [address]  [place] [address] [phone]  [facility] [place] [place] [address] [phone]  [facility]  [address] [place] [address] [phone]  [facility]  [address] [place] [address] [phone]  [facility]  [address] [place], [place] [address] [phone]  [facility]  [address] [place] [address] [phone]  [facility]  [address], [place] [address] [phone]  [facility]  [address] [place] [address] [phone]  [facility]   [address], [place] [address] [phone]  [facility]  [address] [place] [address] [phone]  [facility]  [address] [place] [address] [phone]  [facility]  [address] [place] [address] [phone]   Bunnie Gallop, MSW, LCSW-A  4:04 PM 08/15/2024

## 2024-08-15 NOTE — ED Provider Notes (Signed)
 Emergency Medicine Observation Re-evaluation Note  Edward [name] is a 23 y.o. male, seen on rounds today.  Pt initially presented to the ED for complaints of Suicidal Currently, the patient is awaiting behavioral consult.  Physical Exam  BP 123/68 (BP Location: Right Arm)   Pulse 67   Temp 97.6 F (36.4 C) (Oral)   Resp 16   Ht 5' 11 (1.803 m)   Wt 84.9 kg   SpO2 98%   BMI 26.11 kg/m  Physical Exam Resting and in no acute distress  ED Course / MDM  EKG:   I have reviewed the labs performed to date as well as medications administered while in observation.  Recent changes in the last 24 hours include none.  Plan  Current plan is for behavioral consult.    Suzette Pac, MD 08/15/24 [phone]

## 2024-08-15 NOTE — ED Notes (Signed)
 After TTS consult, pt unable to self-regulate emotions. Attempted to talk pt down for 25-30 minutes. After this time, pt still not in control of emotions. MD notified, dose of Geodon  ordered. Behavioral medication given to pt.

## 2024-08-15 NOTE — ED Notes (Signed)
 Pt asleep resting comfortably. Per Dr. Raford okay to hold meds at this time.

## 2024-08-15 NOTE — ED Notes (Signed)
 Pt woke up appropriate. A/o to self. VSS. Took pills w/o issue. Wanted to go back to sleep.

## 2024-08-15 NOTE — ED Notes (Signed)
TTS Consult complete 

## 2024-08-16 ENCOUNTER — Inpatient Hospital Stay (HOSPITAL_COMMUNITY)
Admission: AD | Admit: 2024-08-16 | Discharge: 2024-08-22 | DRG: 885 | Disposition: A | Discharge status: Home / Self Care | Payer: MEDICAID | Source: Intra-hospital | Attending: Student in an Organized Health Care Education/Training Program | Admitting: Student in an Organized Health Care Education/Training Program

## 2024-08-16 DIAGNOSIS — Z560 Unemployment, unspecified: Secondary | ICD-10-CM

## 2024-08-16 DIAGNOSIS — F314 Bipolar disorder, current episode depressed, severe, without psychotic features: Secondary | ICD-10-CM | POA: Diagnosis not present

## 2024-08-16 DIAGNOSIS — Z555 Less than a high school diploma: Secondary | ICD-10-CM | POA: Diagnosis not present

## 2024-08-16 DIAGNOSIS — F3113 Bipolar disorder, current episode manic without psychotic features, severe: Secondary | ICD-10-CM

## 2024-08-16 DIAGNOSIS — F909 Attention-deficit hyperactivity disorder, unspecified type: Secondary | ICD-10-CM | POA: Diagnosis present

## 2024-08-16 DIAGNOSIS — Z5941 Food insecurity: Secondary | ICD-10-CM | POA: Diagnosis not present

## 2024-08-16 DIAGNOSIS — F913 Oppositional defiant disorder: Secondary | ICD-10-CM | POA: Diagnosis present

## 2024-08-16 DIAGNOSIS — F410 Panic disorder [episodic paroxysmal anxiety] without agoraphobia: Secondary | ICD-10-CM | POA: Diagnosis present

## 2024-08-16 DIAGNOSIS — Z59869 Financial insecurity, unspecified: Secondary | ICD-10-CM

## 2024-08-16 DIAGNOSIS — Z5982 Transportation insecurity: Secondary | ICD-10-CM

## 2024-08-16 DIAGNOSIS — E876 Hypokalemia: Secondary | ICD-10-CM | POA: Diagnosis present

## 2024-08-16 DIAGNOSIS — R45851 Suicidal ideations: Secondary | ICD-10-CM | POA: Diagnosis present

## 2024-08-16 DIAGNOSIS — F4310 Post-traumatic stress disorder, unspecified: Secondary | ICD-10-CM | POA: Diagnosis present

## 2024-08-16 DIAGNOSIS — Z79899 Other long term (current) drug therapy: Secondary | ICD-10-CM | POA: Diagnosis not present

## 2024-08-16 DIAGNOSIS — Z5948 Other specified lack of adequate food: Secondary | ICD-10-CM

## 2024-08-16 DIAGNOSIS — F319 Bipolar disorder, unspecified: Secondary | ICD-10-CM | POA: Diagnosis present

## 2024-08-16 DIAGNOSIS — Z818 Family history of other mental and behavioral disorders: Secondary | ICD-10-CM | POA: Diagnosis not present

## 2024-08-16 DIAGNOSIS — Z59819 Housing instability, housed unspecified: Secondary | ICD-10-CM | POA: Diagnosis not present

## 2024-08-16 DIAGNOSIS — Z9151 Personal history of suicidal behavior: Secondary | ICD-10-CM

## 2024-08-16 DIAGNOSIS — Z59868 Other specified financial insecurity: Secondary | ICD-10-CM | POA: Diagnosis not present

## 2024-08-16 DIAGNOSIS — Z91148 Patient's other noncompliance with medication regimen for other reason: Secondary | ICD-10-CM | POA: Diagnosis not present

## 2024-08-16 DIAGNOSIS — Z62811 Personal history of psychological abuse in childhood: Secondary | ICD-10-CM

## 2024-08-16 DIAGNOSIS — G4710 Hypersomnia, unspecified: Secondary | ICD-10-CM | POA: Diagnosis present

## 2024-08-16 MED ORDER — ACETAMINOPHEN 325 MG PO TABS: 650.0000 mg | ORAL_TABLET | Freq: Four times a day (QID) | ORAL | Status: DC | PRN

## 2024-08-16 MED ORDER — LORAZEPAM 1 MG PO TABS
1.0000 mg | ORAL_TABLET | Freq: Three times a day (TID) | ORAL | Status: DC | PRN
  Administered 2024-08-16: 10:00:00 1 mg via ORAL
  Filled 2024-08-16: qty 1

## 2024-08-16 MED ORDER — DIPHENHYDRAMINE HCL 50 MG/ML IJ SOLN
50.0000 mg | Freq: Three times a day (TID) | INTRAMUSCULAR | Status: DC | PRN
  Filled 2024-08-16: qty 1

## 2024-08-16 MED ORDER — ALUM & MAG HYDROXIDE-SIMETH 200-200-20 MG/5ML PO SUSP: 30.0000 mL | ORAL | Status: DC | PRN

## 2024-08-16 MED ORDER — IBUPROFEN 400 MG PO TABS
600.0000 mg | ORAL_TABLET | Freq: Four times a day (QID) | ORAL | Status: DC | PRN
  Administered 2024-08-16: 10:00:00 600 mg via ORAL
  Filled 2024-08-16: qty 2

## 2024-08-16 MED ORDER — LORAZEPAM 2 MG/ML IJ SOLN
2.0000 mg | Freq: Three times a day (TID) | INTRAMUSCULAR | Status: DC | PRN
  Filled 2024-08-16: qty 1

## 2024-08-16 MED ORDER — MAGNESIUM HYDROXIDE 400 MG/5ML PO SUSP: 30.0000 mL | Freq: Every day | ORAL | Status: DC | PRN

## 2024-08-16 MED ORDER — HYDROXYZINE HCL 25 MG PO TABS
25.0000 mg | ORAL_TABLET | Freq: Three times a day (TID) | ORAL | Status: DC | PRN
  Administered 2024-08-16 – 2024-08-21 (×12): 25 mg via ORAL
  Filled 2024-08-16 (×10): qty 1
  Filled 2024-08-16: qty 40
  Filled 2024-08-16 (×2): qty 1

## 2024-08-16 MED ORDER — HALOPERIDOL 5 MG PO TABS
5.0000 mg | ORAL_TABLET | Freq: Three times a day (TID) | ORAL | Status: DC | PRN
  Administered 2024-08-17 – 2024-08-21 (×3): 5 mg via ORAL
  Filled 2024-08-16 (×3): qty 1

## 2024-08-16 MED ORDER — DIPHENHYDRAMINE HCL 25 MG PO CAPS
50.0000 mg | ORAL_CAPSULE | Freq: Three times a day (TID) | ORAL | Status: DC | PRN
  Administered 2024-08-17 – 2024-08-21 (×3): 50 mg via ORAL
  Filled 2024-08-16 (×3): qty 2

## 2024-08-16 MED ORDER — LORAZEPAM 2 MG/ML IJ SOLN: 2.0000 mg | Freq: Three times a day (TID) | INTRAMUSCULAR | Status: DC | PRN

## 2024-08-16 MED ORDER — ARIPIPRAZOLE 15 MG PO TABS
15.0000 mg | ORAL_TABLET | Freq: Every day | ORAL | Status: DC
  Administered 2024-08-17: 08:00:00 15 mg via ORAL
  Filled 2024-08-16: qty 1

## 2024-08-16 MED ORDER — DIPHENHYDRAMINE HCL 50 MG/ML IJ SOLN: 50.0000 mg | Freq: Three times a day (TID) | INTRAMUSCULAR | Status: DC | PRN

## 2024-08-16 MED ORDER — HALOPERIDOL LACTATE 5 MG/ML IJ SOLN
5.0000 mg | Freq: Three times a day (TID) | INTRAMUSCULAR | Status: DC | PRN
  Filled 2024-08-16: qty 1

## 2024-08-16 MED ORDER — HALOPERIDOL LACTATE 5 MG/ML IJ SOLN: 10.0000 mg | Freq: Three times a day (TID) | INTRAMUSCULAR | Status: DC | PRN

## 2024-08-16 NOTE — ED Notes (Signed)
 Patient is calm and aware about placement at a facility. Pt is questioning when the bed is going to be available. This nurse informed patient that information will be provided when received.

## 2024-08-16 NOTE — [facility] Group Notes (Signed)
 [facility] Group Notes:  (Nursing/MHT/Case Management/Adjunct)  Date:  08/16/2024  Time:  9:06 PM  Type of Therapy:  AA Group  Participation Level:  Did Not Attend  Participation Quality:    Affect:    Cognitive:    Insight:    Engagement in Group:    Modes of Intervention:    Summary of Progress/Problems:  Grayce LITTIE Essex 08/16/2024, 9:06 PM

## 2024-08-16 NOTE — Progress Notes (Signed)
 Pt is a 23 year old male admitted to [facility] after messaging his therapist reporting SI and that he was planning to take all his medication in an attempt to end his life. Pt reports that This is my third time being here, and I don't feel like I get anything out of it each time. Pt reports increased depression, hopelessness, helplessness, and medication non compliance since last discharge. Pt reports he lives with his mother, but does not feel she is a strong support system. When asked about stressors, pt reports that he does not have a job, his ex girlfriend recently broke up with him, and his current living situation is contributing to his symptoms. Pt endorses current passive SI with no plan and verbally contracts for safety. Denies HI/AVH. Skin assessment completed with Robin, MHT and findings were WDL. Pt did not arrive to [facility] with any belongings, reporting that he believes they are still at the other facility. Pt oriented to unit and offered food and beverage. Safety checks initiated at q 15 minutes. Pt remains safe on the unit at this time.

## 2024-08-16 NOTE — ED Provider Notes (Signed)
 Emergency Medicine Observation Re-evaluation Note  Edward [name] is a 23 y.o. male, seen on rounds today.  Pt initially presented to the ED for complaints of Suicidal Currently, the patient is asleep.  Pt presented on 12/13 with si and agitation.  He has been seen by psych and inpatient psych recommended.  No bed yet.  Physical Exam  BP 119/70 (BP Location: Right Arm)   Pulse 79   Temp 98.1 F (36.7 C) (Oral)   Resp 15   Ht 5' 11 (1.803 m)   Wt 84.9 kg   SpO2 99%   BMI 26.11 kg/m  Physical Exam General: asleep Cardiac: rr Lungs: cler Psych: calm  ED Course / MDM  EKG:EKG Interpretation Date/Time:  Sunday August 15 2024 18:44:05 EST Ventricular Rate:  59 PR Interval:  222 QRS Duration:  100 QT Interval:  398 QTC Calculation: 394 R Axis:   77  Text Interpretation: Sinus bradycardia with 1st degree A-V block Otherwise normal ECG When compared with ECG of 23-May-2024 07:32, PREVIOUS ECG IS PRESENT Confirmed by Cleotilde Rogue ([id number]) on 08/15/2024 6:49:36 PM  I have reviewed the labs performed to date as well as medications administered while in observation.  Recent changes in the last 24 hours include none.  Plan  Current plan is for inpatient psych placement.    Dean Clarity, MD 08/16/24 [phone]

## 2024-08-17 ENCOUNTER — Other Ambulatory Visit: Payer: Self-pay

## 2024-08-17 ENCOUNTER — Encounter (HOSPITAL_COMMUNITY): Payer: Self-pay | Admitting: Psychiatry

## 2024-08-17 MED ORDER — ARIPIPRAZOLE 15 MG PO TABS
15.0000 mg | ORAL_TABLET | Freq: Every day | ORAL | Status: DC
  Administered 2024-08-18 – 2024-08-22 (×5): 15 mg via ORAL
  Filled 2024-08-17 (×5): qty 1
  Filled 2024-08-17: qty 14
  Filled 2024-08-17: qty 1

## 2024-08-17 NOTE — Plan of Care (Signed)
   Problem: Education: Goal: Knowledge of [facility] Education information/materials will improve Outcome: Progressing Goal: Emotional status will improve Outcome: Progressing Goal: Mental status will improve Outcome: Progressing Goal: Verbalization of understanding the information provided will improve Outcome: Progressing   Problem: Activity: Goal: Interest or engagement in activities will improve Outcome: Progressing

## 2024-08-17 NOTE — Group Note (Signed)
 Date:  08/17/2024 Time:  9:56 AM  Group Topic/Focus: Goals orientation Goals Group:   The focus of this group is to help patients establish daily goals to achieve during treatment and discuss how the patient can incorporate goal setting into their daily lives to aide in recovery. Orientation:   The focus of this group is to educate the patient on the purpose and policies of crisis stabilization and provide a format to answer questions about their admission.  The group details unit policies and expectations of patients while admitted.    Participation Level:  Did Not Attend   Edward [name] 08/17/2024, 9:56 AM

## 2024-08-17 NOTE — Progress Notes (Signed)
(  Sleep Hours) - 7.25 (Any PRNs that were needed, meds refused, or side effects to meds)- PRN vistaril  25 mg given, no meds refused. (Any disturbances and when (visitation, over night)- None  (Concerns raised by the patient)- None  (SI/HI/AVH)- Endorses passive SI with no plan and verbally contracts for safety. Denies HI/AVH
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
## 2024-08-17 NOTE — [facility] Suicide Risk Assessment (Signed)
 Suicide Risk Assessment  Admission Assessment    [facility] [address] Admission Suicide Risk Assessment   Nursing information obtained from:  Patient Demographic factors:  Male, Adolescent or young adult, Unemployed Current Mental Status:  Suicidal ideation indicated by patient, Suicide plan, Self-harm thoughts Loss Factors:  Loss of significant relationship, Decrease in vocational status Historical Factors:  Prior suicide attempts Risk Reduction Factors:  Living with another person, especially a relative  Total Time spent with patient: 1.5 hours Principal Problem: Bipolar 1 disorder (HCC) Diagnosis:  Principal Problem:   Bipolar 1 disorder (HCC)  Subjective Data: Edward [name] Edward [name] is a 23 year old male with a history of bipolar disorder, attention-deficit disorder, and oppositional defiant disorder who was brought to [facility] by EMS after expressing suicidal ideation via text to his therapist, stating intent to overdose on medications. On arrival, he was reportedly combative and unable to engage, requiring restraints and administration of ziprasidone , and was placed under involuntary commitment. After medical clearance and psychiatric evaluation, he was admitted to the [facility] for safety and stabilization.   Per IVC: He sent a text to his therapist saying he was going to kill himself by taking a drug overdose.    Continued Clinical Symptoms:  Alcohol Use Disorder Identification Test Final Score (AUDIT): 0 The Alcohol Use Disorders Identification Test, Guidelines for Use in Primary Care, Second Edition.  World [employer] [facility]). Score between 0-7:  no or low risk or alcohol related problems. Score between 8-15:  moderate risk of alcohol related problems. Score between 16-19:  high risk of alcohol related problems. Score 20 or above:  warrants further diagnostic evaluation for alcohol dependence and treatment.   CLINICAL FACTORS:   Depression:    Anhedonia Hopelessness Recent sense of peace/wellbeing Severe More than one psychiatric diagnosis Unstable or Poor Therapeutic Relationship Previous Psychiatric Diagnoses and Treatments   Musculoskeletal: Strength & Muscle Tone: within normal limits Gait & Station: normal Patient leans: N/A  Psychiatric Specialty Exam:  Presentation  General Appearance:  Disheveled  Eye Contact: Minimal  Speech: Clear and Coherent; Slow  Speech Volume: Decreased  Handedness: Right   Mood and Affect  Mood: Dysphoric; Hopeless; Worthless  Affect: Animal Nutritionist Processes: Coherent  Descriptions of Associations:Intact  Orientation:Full (Time, Place and Person)  Thought Content:Rumination  History of Schizophrenia/Schizoaffective disorder:No data recorded Duration of Psychotic Symptoms:No data recorded Hallucinations:Hallucinations: None  Ideas of Reference:None  Suicidal Thoughts:Suicidal Thoughts: Yes, Passive SI Passive Intent and/or Plan: Without Intent; Without Plan  Homicidal Thoughts:Homicidal Thoughts: No   Sensorium  Memory: Immediate Good  Judgment: Impaired  Insight: Lacking   Executive Functions  Concentration: Fair  Attention Span: Fair  Recall: Good  Fund of Knowledge: Fair  Language: Fair   Psychomotor Activity  Psychomotor Activity:Psychomotor Activity: Normal   Assets  Assets: Housing; Resilience; Social Support   Sleep  Sleep:Sleep: Good    Physical Exam: Physical Exam ROS Blood pressure 120/77, pulse 64, temperature 98.1 F (36.7 C), resp. rate 18, height 5' 11 (1.803 m), weight 86.8 kg, SpO2 100%. Body mass index is 26.69 kg/m.   COGNITIVE FEATURES THAT CONTRIBUTE TO RISK:  Closed-mindedness and Polarized thinking    SUICIDE RISK:   Moderate:  Frequent suicidal ideation with limited intensity, and duration, some specificity in terms of plans, no associated intent, good self-control,  limited dysphoria/symptomatology, some risk factors present, and identifiable protective factors, including available and accessible social support.  PLAN OF CARE: See H&P for assessment  and plan.   I certify that inpatient services furnished can reasonably be expected to improve the patient's condition.   Blair Chiquita Hint, NP 08/17/2024, 10:18 PM

## 2024-08-17 NOTE — Group Note (Signed)
 LCSW Group Therapy Note   Group Date: 08/17/2024 Start Time: 1100 End Time: 1200   Participation:  did not attend  Type of Therapy:  Group Therapy  Topic:  Healing Hearts:  A Safe Space for Grief     Objective:   The objective of this class is to create a compassionate environment where participants can process their grief, explore different stages of grief, and discover ways to honor their loved ones through personal rituals.  3 Goals: Provide a safe and supportive space where participants feel comfortable sharing their feelings and experiences of grief without judgment. Educate participants about the stages of grief and emphasize that there is no right way to grieve or a fixed timeline for healing. Introduce the concept of rituals as a means to process grief, allowing individuals to honor their loved ones in a personal and meaningful way.  Summary:  In Healing Hearts: A Safe Space for Grief, we explored the unique and personal journey of grief, emphasizing that everyone experiences it differently.  We discussed the five stages of grief (denial, anger, bargaining, depression, and acceptance), with the understanding that grief is not linear.  Rituals were introduced as a way to help cope with loss, offering comfort and connection through meaningful actions such as lighting candles or taking memory walks. Participants were encouraged to express their emotions, focus on self-care, and reflect on moments of gratitude for their loved ones, recognizing that healing is a process and there is no timeline for grief.  Therapeutic Modalities: Elements of CBT: Challenge thoughts, reframe beliefs, self-compassion Elements of DBT: Mindfulness, distress tolerance, emotion regulation Supportive Therapy:  Provide validation, foster a safe and supportive group environment, normalize grief    Rosetta Rupnow O Wessie Shanks, LCSWA 08/17/2024  12:26 PM

## 2024-08-17 NOTE — Group Note (Signed)
 Date:  08/17/2024 Time:  3:50 PM  Group Topic/Focus: Sleep Hygiene Dimensions of Wellness:   The focus of this group is to introduce the topic of wellness and discuss the role each dimension of wellness plays in total health.    Participation Level:  Did Not Attend   Shanda JONETTA Challenger 08/17/2024, 3:50 PM

## 2024-08-17 NOTE — Group Note (Signed)
 Recreation Therapy Group Note   Group Topic:Animal Assisted Therapy   Group Date: 08/17/2024 Start Time: 0945 End Time: 1030 Facilitators: Quincy Prisco-McCall, LRT,CTRS Location: [address] [employer]   AAA/T Program Assumption of Risk Form signed by Patient/ or Parent Legal Guardian Yes  Patient understands his/her participation is voluntary Yes  Behavioral Response:    Education: Charity Fundraiser, Appropriate Animal Interaction   Education Outcome: Acknowledges education.    Clinical Observations/Individualized Feedback: Pet therapy didn't take place due medical situation with handlers older dog.      Plan: Continue to engage patient in RT group sessions 2-3x/week.   Tanelle Lanzo-McCall, LRT,CTRS 08/17/2024 11:52 AM

## 2024-08-17 NOTE — Group Note (Signed)
 Date:  08/17/2024 Time:  4:04 PM  Group Topic/Focus: social work group on grief Social work with grieving adults in mental health settings focuses on supporting individuals as they cope with loss while addressing the emotional, psychological, and social impacts of grief. Social workers assess the nature of the loss, the clients coping abilities, support systems, and any co-occurring mental health concerns such as depression or anxiety. Using a strengths-based and culturally sensitive approach, they provide emotional support, normalize grief reactions, offer psychoeducation, and apply therapeutic interventions to help clients process their feelings and adjust to life changes. The goal is not to eliminate grief, but to promote healthy adaptation, resilience, and improved mental well-being.    Participation Level:  Did Not Attend    Edward [name] 08/17/2024, 4:04 PM

## 2024-08-17 NOTE — Group Note (Signed)
 Date:  08/17/2024 Time:  11:27 AM  Group Topic/Focus: Holiday Music Holiday music therapy supports adult mental health by reducing stress and anxiety, improving mood, fostering emotional expression, and easing loneliness during a season that can intensify both joy and distress. Through guided listening, gentle singing, lyric discussion, or simple instrument play, adults can process emotions, access positive memories, and experience grounding and connection in a nonverbal, low-pressure way. When used with choice and cultural sensitivity, holiday music therapy is especially helpful for adults coping with depression, anxiety, grief, trauma, or social isolation, making it a valuable, trauma-informed therapeutic approach during the holidays.    Participation Level:  Did Not Attend   Sharelle laws  08/17/2024, 11:27 AM

## 2024-08-17 NOTE — Tx Team (Signed)
 Initial Treatment Plan 08/17/2024 5:01 AM Darlynn Sole FMW:[id number]    PATIENT STRESSORS: Financial difficulties   Marital or family conflict   Medication change or noncompliance   Occupational concerns     PATIENT STRENGTHS: Ability for insight  Forensic Psychologist fund of knowledge    PATIENT IDENTIFIED PROBLEMS: Self harm thoughts  Suicidal ideation with plan to overdose on medication   Hopelessness                  DISCHARGE CRITERIA:  Ability to meet basic life and health needs Improved stabilization in mood, thinking, and/or behavior Motivation to continue treatment in a less acute level of care Verbal commitment to aftercare and medication compliance  PRELIMINARY DISCHARGE PLAN: Outpatient therapy Return to previous living arrangement  PATIENT/FAMILY INVOLVEMENT: This treatment plan has been presented to and reviewed with the patient, Vrishank Paez. The patient has been given the opportunity to ask questions and make suggestions.  Jansel Vonstein C D'Addio, RN 08/17/2024, 5:01 AM

## 2024-08-17 NOTE — [facility] Group Notes (Signed)
 Adult Psychoeducational Group Note  Date:  08/17/2024 Time:  9:04 PM  Group Topic/Focus:  Wrap-Up Group:   The focus of this group is to help patients review their daily goal of treatment and discuss progress on daily workbooks.  Participation Level:  Did Not Attend  Aisha Celestine Ruth 08/17/2024, 9:04 PM

## 2024-08-17 NOTE — Group Note (Signed)
 Date:  08/17/2024 Time:  4:45 PM  Group Topic/Focus: Ice Breaker A simple and effective icebreaker for adult group therapy is the [employer], where each participant shares one word that describes how they are feeling at the start of the session. This activity creates a low-pressure opportunity for self-expression, helps normalize a range of emotions, and encourages emotional awareness without requiring participants to disclose more than they are comfortable sharing. It also allows the facilitator to quickly gauge the groups emotional state and sets a supportive, respectful tone for the session, making it especially suitable for mental health groups involving anxiety, depression, or grief.    Participation Level:  Did Not Attend   Edward [name] 08/17/2024, 4:45 PM

## 2024-08-17 NOTE — Plan of Care (Addendum)
 Pt A & O X4. Denies HI, AVH and pain when assessed. Presents with flat affect, depressed mood and irritable on interactions. Pt endorsed passive SI this shift without a plan I wish I didn't wake up this morning. I don't have nothing going for me. I don't have a job, no money. I'm not going to physically hurt myself but I wish I wasn't here. Rates his depression 10/10, anxiety 9/10 and anger 0/10 when assessed. Noted lying on floor in hallway covered up with his blanket; refused to get up, agitated when redirected multiple times by staff. Refused to attend scheduled groups despite multiple prompts I was just here. I've done all these groups, nothing is helping me. This is just a waste of one week. PRN haldol  5 mg and benadryl  50 mg both PO given for escalating agitation; pulling paint off the wall despite multiple verbal redirections. Pt calmer and hygiene supplies given when reassessed 1750. Emotional support, encouragement and reassurance offered. Safety maintained at Q 15 minutes intervals without self harm gestures. Problem: Safety: Goal: Periods of time without injury will increase Outcome: Progressing   Problem: Coping: Goal: Ability to verbalize frustrations and anger appropriately will improve Outcome: Not Progressing   Problem: Coping: Goal: Ability to demonstrate self-control will improve Outcome: Not Progressing

## 2024-08-18 ENCOUNTER — Encounter (HOSPITAL_COMMUNITY): Payer: Self-pay

## 2024-08-18 ENCOUNTER — Telehealth (HOSPITAL_COMMUNITY): Payer: Self-pay | Admitting: Pharmacy Technician

## 2024-08-18 ENCOUNTER — Other Ambulatory Visit (HOSPITAL_COMMUNITY): Payer: Self-pay

## 2024-08-18 DIAGNOSIS — F319 Bipolar disorder, unspecified: Principal | ICD-10-CM

## 2024-08-18 LAB — BASIC METABOLIC PANEL WITH GFR
Anion gap: 8 (ref 5–15)
BUN: 12 mg/dL (ref 6–20)
CO2: 31 mmol/L (ref 22–32)
Calcium: 9.8 mg/dL (ref 8.9–10.3)
Chloride: 103 mmol/L (ref 98–111)
Creatinine, Ser: 1 mg/dL (ref 0.61–1.24)
GFR, Estimated: >60 mL/min (ref >60)
Glucose, Bld: 83 mg/dL (ref 70–99)
Potassium: 4 mmol/L (ref 3.5–5.1)
Sodium: 142 mmol/L (ref 135–145)

## 2024-08-18 NOTE — Plan of Care (Signed)
   Problem: Health Behavior/Discharge Planning: Goal: Compliance with treatment plan for underlying cause of condition will improve Outcome: Progressing   Problem: Safety: Goal: Periods of time without injury will increase Outcome: Progressing

## 2024-08-18 NOTE — Progress Notes (Addendum)
 [facility] MD Progress Note  08/18/2024 7:47 AM Edward [name]  MRN:  [id number] Principal Problem: Bipolar 1 disorder (HCC) Diagnosis: Principal Problem:   Bipolar 1 disorder (HCC)   Reason for Admission: Edward [name] [name] Daria Hickman is a 23 year old male with a history of bipolar disorder, attention-deficit disorder, and oppositional defiant disorder who was brought to [facility] by EMS after expressing suicidal ideation via text to his therapist, stating intent to overdose on medications. On arrival, he was reportedly combative and unable to engage, requiring restraints and administration of ziprasidone , and was placed under involuntary commitment. After medical clearance and psychiatric evaluation, he was admitted to the [facility] for safety and stabilization.     24-hour Chart Review: Chart reviewed. Patient's case discussed in interdisciplinary team meeting.  Vital signs reviewed: Pulse Rate 52 this morning. No as needed psychotropic medication required overnight.  He slept a documented 4.5 hours.  He is adherent to taking psychotropic medication regimen. Nursing notes indicate he received mild agitation protocol yesterday afternoon due to escalating agitation.  Daily Assessment Note: The patient reports his mood as I dont know.. I cant say bad, I cant say good. He states that his appetite and sleep are too much. He denies psychotic symptoms and denies active suicidal ideation, though he continues to endorse passive thoughts that he may be better off dead. He denies homicidal ideation and reports no side effects from his current psychiatric medications, including akathisia. He states that he is not attending unit groups because he does not find them helpful for his situation; he was encouraged to attend and consider applying coping skills discussed in groups despite differing personal circumstances. He reports speaking briefly with his mother by phone and describes the  interaction as fine but limited. He notes that laboratory staff were unable to obtain blood this morning and instructed him to increase fluid intake, with plans to attempt the draw again later today; repeat potassium will be reordered for tonights blood draw.  The patient reports significant psychosocial stressors, including transportation barriers, difficulty obtaining medications, challenges attending appointments, and financial concerns. He reports having applied for disability in the past but was denied around June 2025 and was encouraged to reapply. Strategies to improve medication adherence and follow-up were discussed, including mail delivery of medications, virtual outpatient appointments, and an ACT team re-referral, to which the patient consented. He is agreeable to receiving an Abilify  LAI prior to discharge. He reports last taking oral Abilify  approximately three weeks ago. He also discussed educational goals, noting he is pursuing CED coursework in campus and [place] but is currently not attending classes and is struggling with math; he was encouraged to utilize online resources or seek tutoring support. The patient reports limited social supports, stating he has no friends and only speaks with his mother, whom he perceives as not supportive.  Total Time spent with patient: 45 minutes  Past Psychiatric History: See H&P  Past Medical History:  Past Medical History:  Diagnosis Date   ADHD    Bipolar 1 disorder (HCC)    Intellectual developmental disorder, mild    ODD (oppositional defiant disorder)    Vitamin D  insufficiency 08/30/2023   History reviewed. No pertinent surgical history. Family History: History reviewed. No pertinent family history. Family Psychiatric  History: See H&P Social History:  Social History   Substance and Sexual Activity  Alcohol Use No     Social History   Substance and Sexual Activity  Drug Use No  Social History   Socioeconomic History    Marital status: Single    Spouse name: Not on file   Number of children: Not on file   Years of education: Not on file   Highest education level: Not on file  Occupational History   Not on file  Tobacco Use   Smoking status: Never   Smokeless tobacco: Never  Substance and Sexual Activity   Alcohol use: No   Drug use: No   Sexual activity: Not on file  Other Topics Concern   Not on file  Social History Narrative   Not on file   Social Drivers of Health   Tobacco Use: Low Risk (08/17/2024)   Patient History    Smoking Tobacco Use: Never    Smokeless Tobacco Use: Never    Passive Exposure: Not on file  Financial Resource Strain: Not on file  Food Insecurity: No Food Insecurity (08/16/2024)   Epic    Worried About Programme Researcher, Broadcasting/film/video in the Last Year: Never true    Ran Out of Food in the Last Year: Never true  Recent Concern: Food Insecurity - Food Insecurity Present (05/24/2024)   Epic    Worried About Programme Researcher, Broadcasting/film/video in the Last Year: Sometimes true    Ran Out of Food in the Last Year: Sometimes true  Transportation Needs: Unmet Transportation Needs (08/16/2024)   Epic    Lack of Transportation (Medical): Yes    Lack of Transportation (Non-Medical): No  Physical Activity: Not on file  Stress: Not on file  Social Connections: Unknown (01/15/2022)   Received from [facility]   Social Network    Social Network: Not on file  Depression (PHQ2-9): High Risk (06/16/2024)   Depression (PHQ2-9)    PHQ-2 Score: 16  Alcohol Screen: Low Risk (08/16/2024)   Alcohol Screen    Last Alcohol Screening Score (AUDIT): 0  Housing: Low Risk (08/16/2024)   Epic    Unable to Pay for Housing in the Last Year: No    Number of Times Moved in the Last Year: 0    Homeless in the Last Year: No  Recent Concern: Housing - High Risk (05/24/2024)   Epic    Unable to Pay for Housing in the Last Year: Yes    Number of Times Moved in the Last Year: 2    Homeless in the Last Year: Patient  declined  Utilities: Not At Risk (08/16/2024)   Epic    Threatened with loss of utilities: No  Health Literacy: Not on file   Additional Social History:                         Current Medications: Current Facility-Administered Medications  Medication Dose Route Frequency Provider Last Rate Last Admin   acetaminophen  (TYLENOL ) tablet 650 mg  650 mg Oral Q6H PRN Motley-Mangrum, Jadeka A, PMHNP       alum & mag hydroxide-simeth (MAALOX/MYLANTA) 200-200-20 MG/5ML suspension 30 mL  30 mL Oral Q4H PRN Motley-Mangrum, Jadeka A, PMHNP       ARIPiprazole  (ABILIFY ) tablet 15 mg  15 mg Oral Daily Amulya Quintin H, NP       haloperidol  (HALDOL ) tablet 5 mg  5 mg Oral TID PRN Motley-Mangrum, Jadeka A, PMHNP   5 mg at 08/17/24 1653   And   diphenhydrAMINE  (BENADRYL ) capsule 50 mg  50 mg Oral TID PRN Motley-Mangrum, Jadeka A, PMHNP   50 mg at 08/17/24 1653  haloperidol  lactate (HALDOL ) injection 5 mg  5 mg Intramuscular TID PRN Motley-Mangrum, Jadeka A, PMHNP       And   diphenhydrAMINE  (BENADRYL ) injection 50 mg  50 mg Intramuscular TID PRN Motley-Mangrum, Jadeka A, PMHNP       And   LORazepam  (ATIVAN ) injection 2 mg  2 mg Intramuscular TID PRN Motley-Mangrum, Jadeka A, PMHNP       haloperidol  lactate (HALDOL ) injection 10 mg  10 mg Intramuscular TID PRN Motley-Mangrum, Jadeka A, PMHNP       And   diphenhydrAMINE  (BENADRYL ) injection 50 mg  50 mg Intramuscular TID PRN Motley-Mangrum, Jadeka A, PMHNP       And   LORazepam  (ATIVAN ) injection 2 mg  2 mg Intramuscular TID PRN Motley-Mangrum, Jadeka A, PMHNP       hydrOXYzine  (ATARAX ) tablet 25 mg  25 mg Oral TID PRN Motley-Mangrum, Jadeka A, PMHNP   25 mg at 08/17/24 1938   magnesium  hydroxide (MILK OF MAGNESIA) suspension 30 mL  30 mL Oral Daily PRN Motley-Mangrum, Jadeka A, PMHNP        Lab Results: No results found for this or any previous visit (from the past 48 hours).  Blood Alcohol level:  Lab Results  Component Value Date    [facility] <15 08/14/2024   ETH <15 05/22/2024    Metabolic Disorder Labs: Lab Results  Component Value Date   HGBA1C 5.2 05/25/2024   MPG 102.54 05/25/2024   MPG 111 08/29/2023   No results found for: PROLACTIN Lab Results  Component Value Date   CHOL 174 05/25/2024   TRIG 85 05/25/2024   HDL 40 (L) 05/25/2024   CHOLHDL 4.4 05/25/2024   VLDL 17 05/25/2024   LDLCALC 117 (H) 05/25/2024   LDLCALC 70 08/29/2023    Physical Findings: AIMS:  ,  ,  ,  ,  ,  ,   CIWA:   N/A COWS:   N/A  Musculoskeletal: Strength & Muscle Tone: within normal limits Gait & Station: normal Patient leans: N/A  Psychiatric Specialty Exam:  Presentation  General Appearance:  Disheveled  Eye Contact: Minimal  Speech: Clear and Coherent; Slow  Speech Volume: Decreased  Handedness: Right   Mood and Affect  Mood: Dysphoric; Hopeless; Worthless  Affect: Animal Nutritionist Processes: Coherent  Descriptions of Associations:Intact  Orientation:Full (Time, Place and Person)  Thought Content:Rumination  History of Schizophrenia/Schizoaffective disorder:No data recorded Duration of Psychotic Symptoms:No data recorded Hallucinations:Hallucinations: None  Ideas of Reference:None  Suicidal Thoughts:Suicidal Thoughts: Yes, Passive SI Passive Intent and/or Plan: Without Intent; Without Plan  Homicidal Thoughts:Homicidal Thoughts: No   Sensorium  Memory: Immediate Good  Judgment: Impaired  Insight: Lacking   Executive Functions  Concentration: Fair  Attention Span: Fair  Recall: Good  Fund of Knowledge: Fair  Language: Fair   Psychomotor Activity  Psychomotor Activity: Psychomotor Activity: Normal   Assets  Assets: Housing; Resilience; Social Support   Sleep  Sleep: Sleep: Good    Physical Exam: Physical Exam Vitals and nursing note reviewed.  Constitutional:      General: He is not in acute distress.    Appearance: He is not  ill-appearing.  HENT:     Head: Normocephalic.     Mouth/Throat:     Pharynx: Oropharynx is clear.  Pulmonary:     Effort: No respiratory distress.  Musculoskeletal:        General: Normal range of motion.  Skin:    General: Skin is dry.  Neurological:     Mental Status: He is alert and oriented to person, place, and time.    Review of Systems  Psychiatric/Behavioral:  Positive for depression and suicidal ideas. Negative for hallucinations and substance abuse. The patient is nervous/anxious. The patient does not have insomnia.   All other systems reviewed and are negative.  Blood pressure 121/73, pulse (!) 52, temperature (!) 97.5 F (36.4 C), temperature source Oral, resp. rate 16, height 5' 11 (1.803 m), weight 86.8 kg, SpO2 100%. Body mass index is 26.69 kg/m.   Treatment Plan Summary: Daily contact with patient to assess and evaluate symptoms and progress in treatment and Medication management  Assessment:  23 year old male presents following disclosure of suicidal ideation with a plan to overdose, though he denies intent at this time. He continues to endorse passive suicidal ideation and exhibits multiple psychosocial stressors, medication nonadherence, and poor outpatient follow-up. His presentation is consistent with a depressive episode with comorbid anxiety and PTSD symptoms. Risk factors include prior suicide attempt, unemployment, limited support system, recent relationship loss, and poor insight. Continued inpatient stabilization, medication management, and discharge planning with community supports are warranted. Admission labs were notable for hypokalemia (K? 3.0), for which he received potassium supplementation in the ED.; will recheck potassium tomorrow morning.   08/18/24: Overall, his presentation remains consistent with a depressive episode in the context of medication nonadherence, poor outpatient follow-up, and multiple psychosocial stressors.The case was reviewed  with the attending psychiatrist, and the plan is to continue the current psychiatric treatment regimen without changes today, proceed with LAI Abilify  prior to discharge, 14-day supply of oral Abilify  afterward (per pharmacy recommendations), re-refer to ACT services if eligible, and continue discharge planning with community supports. Continued inpatient psychiatric hospitalization is recommended at this time. Admission labs were notable for hypokalemia (K? 3.0), for which he received potassium supplementation in the ED. Laboratory staff were reportedly unable to obtain blood this morning; he is instructed to increase fluid intake; repeat potassium will be reordered for tonights blood draw.   Diagnoses / Active Problems:  Principal Problem:   Bipolar 1 disorder (HCC)    PLAN: Safety and Monitoring:             -- Involuntary (Will uphold) admission to inpatient psychiatric unit for safety, stabilization and treatment             -- Daily contact with patient to assess and evaluate symptoms and progress in treatment             -- Patient's case to be discussed in multi-disciplinary team meeting             -- Observation Level: q15 minute checks             -- Vital signs:  q12 hours             -- Precautions: suicide, elopement, and assault   2. Psychiatric Diagnoses and Treatment:               # Bipolar disorder   -- Continue Abilify  15 mg oral daily  -- Restart Abilify  LAI prior to discharge -- Hydroxyzine  25 mg oral, 3 times daily as needed, anxiety             -- Haldol  BH Agitation Protocol (See MAR)              -- Abilify  LAI prior to discharge               --  Re-referral to ACT Team services if eligible             -- Arrange mail delivery of medications at discharge              3. Medical Issues Being Addressed:         N/A   4. Labs  -- CBC: Unremarkable             -- CMP: Potassium 3.0 (received potassium supplementation in the ED.; will recheck potassium tomorrow  morning), Blood glucose 127, Total bilirubin 1.3, Anion gap 17, otherwise WNL             -- Ethanol: <15             -- Lipid Panel (05/25/2024): HDL 40, LDL Cholesterol 117, otherwise WNL             -- HgBA1c (05/25/2024): WNL             -- UDS: Unremarkable             -- QT/QTc 398/394    -- The risks/benefits/side-effects/alternatives to this medication were discussed in detail with the patient and time was given for questions. The patient consents to medication trial.  -- FDA -- Metabolic profile and EKG monitoring obtained while on an atypical antipsychotic (BMI: Lipid Panel: HbgA1c: QTc:)               -- Encouraged patient to participate in unit milieu and in scheduled group therapies  -- Short Term Goals: Ability to identify changes in lifestyle to reduce recurrence of condition will improve, Ability to verbalize feelings will improve, Ability to disclose and discuss suicidal ideas, Ability to demonstrate self-control will improve, Ability to identify and develop effective coping behaviors will improve, Ability to maintain clinical measurements within normal limits will improve, Compliance with prescribed medications will improve, and Ability to identify triggers associated with substance abuse/mental health issues will improve             -- Long Term Goals: Improvement in symptoms so as ready for discharge     5. Discharge Planning:  -- Social work and case management to assist with discharge planning and identification of hospital follow-up needs prior to discharge -- Estimated LOS: 5-7 days -- Discharge Concerns: Need to establish a safety plan; Medication compliance and effectiveness -- Discharge Goals: Return home with outpatient referrals for mental health follow-up including medication management/psychotherapy      Physician Treatment Plan for Primary Diagnosis: Bipolar 1 disorder (HCC) Long Term Goal(s): Improvement in symptoms so as ready for discharge   Short Term Goals:  Ability to identify changes in lifestyle to reduce recurrence of condition will improve, Ability to verbalize feelings will improve, Ability to disclose and discuss suicidal ideas, Ability to demonstrate self-control will improve, Ability to identify and develop effective coping behaviors will improve, Ability to maintain clinical measurements within normal limits will improve, Compliance with prescribed medications will improve, and Ability to identify triggers associated with substance abuse/mental health issues will improve   I certify that inpatient services furnished can reasonably be expected to improve the patient's condition.      Blair Chiquita Hint, NP 08/18/2024, 7:47 AM

## 2024-08-18 NOTE — Plan of Care (Signed)

## 2024-08-18 NOTE — BH Assessment (Signed)
(  Sleep Hours) - 4.5 (Any PRNs that were needed, meds refused, or side effects to meds)-  (Any disturbances and when (visitation, over night)- None (Concerns raised by the patient)- None (SI/HI/AVH)- Denies but said; I wish I go to sleep and not wake up.

## 2024-08-18 NOTE — [facility] Suicide Risk Assessment (Signed)
 [facility] INPATIENT:  Family/Significant Other Suicide Prevention Education  Suicide Prevention Education:  Contact Attempts: [name], mom, [phone]  (name of family member/significant other) has been identified by the patient as the family member/significant other with whom the patient will be residing, and identified as the person(s) who will aid the patient in the event of a mental health crisis.  With written consent from the patient, two attempts were made to provide suicide prevention education, prior to and/or following the patient's discharge.  We were unsuccessful in providing suicide prevention education.  A suicide education pamphlet was given to the patient to share with family/significant other.  Date and time of first attempt:  Dec17/@1156    Golda Louder LCSWA 08/18/2024, 11:56 AM

## 2024-08-18 NOTE — Progress Notes (Signed)
(  Sleep Hours) -7.5  (Any PRNs that were needed, meds refused, or side effects to meds)- hydroxyzine  25mg   (Any disturbances and when (visitation, over night)-none  (Concerns raised by the patient)- states I'm just daydreaming about home  (SI/HI/AVH)-denies

## 2024-08-18 NOTE — Group Note (Signed)
 Date:  08/18/2024 Time:  3:18 PM  Group Topic/Focus: Spiritual Wellness with Chaplain    Pt did not attend spiritual wellness group with the chaplain  Avrielle Fry R Greycen Felter 08/18/2024, 3:18 PM

## 2024-08-18 NOTE — Group Note (Signed)
 Date:  08/18/2024 Time:  9:43 AM  Group Topic/Focus:  Goals Group:   The focus of this group is to help patients establish daily goals to achieve during treatment and discuss how the patient can incorporate goal setting into their daily lives to aide in recovery. Orientation:   The focus of this group is to educate the patient on the purpose and policies of crisis stabilization and provide a format to answer questions about their admission.  The group details unit policies and expectations of patients while admitted.    Participation Level:  Did Not Attend   Lauris JONELLE Morales 08/18/2024, 9:43 AM

## 2024-08-18 NOTE — Group Note (Signed)
 Date:  08/18/2024 Time:  10:10 AM  Group Topic/Focus: Recreational Therapy    Pt did not attend recreational therapy group  Edward [name] [name] 08/18/2024, 10:10 AM

## 2024-08-18 NOTE — BH IP Treatment Plan (Signed)
 Interdisciplinary Treatment and Diagnostic Plan Update  08/18/2024 Time of Session: 1015 AM Edward [name] MRN: [id number]  Principal Diagnosis: Bipolar 1 disorder (HCC)  Secondary Diagnoses: Principal Problem:   Bipolar 1 disorder (HCC)   Current Medications:  Current Facility-Administered Medications  Medication Dose Route Frequency Provider Last Rate Last Admin   acetaminophen  (TYLENOL ) tablet 650 mg  650 mg Oral Q6H PRN Motley-Mangrum, Jadeka A, PMHNP       alum & mag hydroxide-simeth (MAALOX/MYLANTA) 200-200-20 MG/5ML suspension 30 mL  30 mL Oral Q4H PRN Motley-Mangrum, Jadeka A, PMHNP       ARIPiprazole  (ABILIFY ) tablet 15 mg  15 mg Oral Daily Bennett, Christal H, NP   15 mg at 08/18/24 1043   haloperidol  (HALDOL ) tablet 5 mg  5 mg Oral TID PRN Motley-Mangrum, Jadeka A, PMHNP   5 mg at 08/17/24 1653   And   diphenhydrAMINE  (BENADRYL ) capsule 50 mg  50 mg Oral TID PRN Motley-Mangrum, Jadeka A, PMHNP   50 mg at 08/17/24 1653   haloperidol  lactate (HALDOL ) injection 5 mg  5 mg Intramuscular TID PRN Motley-Mangrum, Jadeka A, PMHNP       And   diphenhydrAMINE  (BENADRYL ) injection 50 mg  50 mg Intramuscular TID PRN Motley-Mangrum, Jadeka A, PMHNP       And   LORazepam  (ATIVAN ) injection 2 mg  2 mg Intramuscular TID PRN Motley-Mangrum, Jadeka A, PMHNP       haloperidol  lactate (HALDOL ) injection 10 mg  10 mg Intramuscular TID PRN Motley-Mangrum, Jadeka A, PMHNP       And   diphenhydrAMINE  (BENADRYL ) injection 50 mg  50 mg Intramuscular TID PRN Motley-Mangrum, Jadeka A, PMHNP       And   LORazepam  (ATIVAN ) injection 2 mg  2 mg Intramuscular TID PRN Motley-Mangrum, Jadeka A, PMHNP       hydrOXYzine  (ATARAX ) tablet 25 mg  25 mg Oral TID PRN Motley-Mangrum, Jadeka A, PMHNP   25 mg at 08/17/24 1938   magnesium  hydroxide (MILK OF MAGNESIA) suspension 30 mL  30 mL Oral Daily PRN Motley-Mangrum, Jadeka A, PMHNP       PTA Medications: Medications Prior to Admission  Medication Sig Dispense  Refill Last Dose/Taking   ARIPiprazole  (ABILIFY ) 15 MG tablet Take 1 tablet (15 mg total) by mouth daily for 14 days. 14 tablet 0 Past Month   ARIPiprazole  ER (ABILIFY  MAINTENA) 400 MG SRER injection Inject 2 mLs (400 mg total) into the muscle every 28 (twenty-eight) days.       Patient Stressors: Financial difficulties   Marital or family conflict   Medication change or noncompliance   Occupational concerns    Patient Strengths: Ability for Copy fund of knowledge   Treatment Modalities: Medication Management, Group therapy, Case management,  1 to 1 session with clinician, Psychoeducation, Recreational therapy.   Physician Treatment Plan for Primary Diagnosis: Bipolar 1 disorder (HCC) Long Term Goal(s): Improvement in symptoms so as ready for discharge   Short Term Goals: Ability to identify changes in lifestyle to reduce recurrence of condition will improve Ability to verbalize feelings will improve Ability to disclose and discuss suicidal ideas Ability to demonstrate self-control will improve Ability to identify and develop effective coping behaviors will improve Ability to maintain clinical measurements within normal limits will improve Compliance with prescribed medications will improve Ability to identify triggers associated with substance abuse/mental health issues will improve  Medication Management: Evaluate patient's response, side effects, and tolerance of medication regimen.  Therapeutic  Interventions: 1 to 1 sessions, Unit Group sessions and Medication administration.  Evaluation of Outcomes: Not Progressing  Physician Treatment Plan for Secondary Diagnosis: Principal Problem:   Bipolar 1 disorder (HCC)  Long Term Goal(s): Improvement in symptoms so as ready for discharge   Short Term Goals: Ability to identify changes in lifestyle to reduce recurrence of condition will improve Ability to verbalize feelings will improve Ability  to disclose and discuss suicidal ideas Ability to demonstrate self-control will improve Ability to identify and develop effective coping behaviors will improve Ability to maintain clinical measurements within normal limits will improve Compliance with prescribed medications will improve Ability to identify triggers associated with substance abuse/mental health issues will improve     Medication Management: Evaluate patient's response, side effects, and tolerance of medication regimen.  Therapeutic Interventions: 1 to 1 sessions, Unit Group sessions and Medication administration.  Evaluation of Outcomes: Not Progressing   RN Treatment Plan for Primary Diagnosis: Bipolar 1 disorder (HCC) Long Term Goal(s): Knowledge of disease and therapeutic regimen to maintain health will improve  Short Term Goals: Ability to remain free from injury will improve, Ability to verbalize frustration and anger appropriately will improve, Ability to demonstrate self-control, Ability to participate in decision making will improve, Ability to verbalize feelings will improve, Ability to disclose and discuss suicidal ideas, Ability to identify and develop effective coping behaviors will improve, and Compliance with prescribed medications will improve  Medication Management: RN will administer medications as ordered by provider, will assess and evaluate patient's response and provide education to patient for prescribed medication. RN will report any adverse and/or side effects to prescribing provider.  Therapeutic Interventions: 1 on 1 counseling sessions, Psychoeducation, Medication administration, Evaluate responses to treatment, Monitor vital signs and CBGs as ordered, Perform/monitor CIWA, COWS, AIMS and Fall Risk screenings as ordered, Perform wound care treatments as ordered.  Evaluation of Outcomes: Not Progressing   LCSW Treatment Plan for Primary Diagnosis: Bipolar 1 disorder (HCC) Long Term Goal(s): Safe  transition to appropriate next level of care at discharge, Engage patient in therapeutic group addressing interpersonal concerns.  Short Term Goals: Engage patient in aftercare planning with referrals and resources, Increase social support, Increase ability to appropriately verbalize feelings, Increase emotional regulation, Facilitate acceptance of mental health diagnosis and concerns, Facilitate patient progression through stages of change regarding substance use diagnoses and concerns, Identify triggers associated with mental health/substance abuse issues, and Increase skills for wellness and recovery  Therapeutic Interventions: Assess for all discharge needs, 1 to 1 time with Social worker, Explore available resources and support systems, Assess for adequacy in community support network, Educate family and significant other(s) on suicide prevention, Complete Psychosocial Assessment, Interpersonal group therapy.  Evaluation of Outcomes: Not Progressing   Progress in Treatment: Attending groups: No. Participating in groups: No. Taking medication as prescribed: Yes. Toleration medication: Yes. Family/Significant other contact made: No, will contact:  [name], mom, [phone] Patient understands diagnosis: Yes. Discussing patient identified problems/goals with staff: Yes. Medical problems stabilized or resolved: Yes. Denies suicidal/homicidal ideation: No. Issues/concerns per patient self-inventory: No.  New problem(s) identified: No, Describe:  none  New Short Term/Long Term Goal(s): medication stabilization, elimination of SI thoughts, development of comprehensive mental wellness plan.    Patient Goals:  Staying calm  Discharge Plan or Barriers: Patient recently admitted. CSW will continue to follow and assess for appropriate referrals and possible discharge planning.    Reason for Continuation of Hospitalization: Medication stabilization Suicidal ideation  Estimated  Length of Stay: 5-7  days  Last 3 [place] Suicide Severity Risk Score: Flowsheet Row [facility] from 08/14/2024 in [facility] (Discharged[facility]  C-SSRS RISK CATEGORY Moderate Risk High Risk High Risk    Last PHQ 2/9 Scores:    06/16/2024    1:45 PM 03/19/2024    3:49 PM 03/16/2024    1:42 PM  Depression screen PHQ 2/9  Decreased Interest 3 3 2   Down, Depressed, Hopeless 3 3 3   PHQ - 2 Score 6 6 5   Altered sleeping 1 3 3   Tired, decreased energy 2 3 2   Change in appetite 0 1 2  Feeling bad or failure about yourself  2 2 3   Trouble concentrating 3 3 3   Moving slowly or fidgety/restless 0 2 1  Suicidal thoughts 2 3 3   PHQ-9 Score 16  23  22    Difficult doing work/chores Very difficult Very difficult Very difficult     Data saved with a previous flowsheet row definition    Scribe for Treatment Team: Jenkins LULLA Primer, LCSWA 08/18/2024 1:14 PM

## 2024-08-18 NOTE — Group Note (Signed)
 Date:  08/18/2024 Time:  12:43 PM  Group Topic/Focus: Pharmacy    Pt did not attend pharmacy group  Cherrie Franca R Devina Bezold 08/18/2024, 12:43 PM

## 2024-08-18 NOTE — Telephone Encounter (Signed)
 Patient Product/process Development Scientist completed.    The patient is insured through [employer] [place].     Ran test claim for Abilify  Maintena 400 mg and the current 30 day co-pay is $4.00.   This test claim was processed through [facility]- copay amounts may vary at other pharmacies due to pharmacy/plan contracts, or as the patient moves through the different stages of their insurance plan.     Reyes Sharps, CPHT Pharmacy Technician Patient Advocate Specialist Lead [facility] Patient Advocate Team Direct Number: [phone]  Fax: [phone]

## 2024-08-18 NOTE — Progress Notes (Signed)
 Edward [name]   Type of Note: [facility] - ACTT  Spoke with patient this morning regarding an ACT team. Pt was agreeable and interested in having ACT services.   This clinical research associate called and spoke with Ruby at [facility] [phone]) who reports they have openings on their ACT team, servicing [place] county. There is no specific referral that is needed from the hospital. Patient must go during their walk in hours (7 days a week, 24 hours a day) for an in person assessment. At that appointment patient will need to request to be considered for ACTT. After the initial in-person appointment, other options are available for f/u visits (virtual or if accepted to ACT, they will come to pt).    Ruby advised that the pt call their tailored plan care manager through Medicaid to coordinate transportation to the [facility] office whenever is convenient as they are open 24/7.   No patient specific information was provided to this agency.  Information added to discharge follow up.   Signed:  Aixa Corsello, LCSW-A 08/18/2024  2:59 PM

## 2024-08-18 NOTE — Plan of Care (Signed)
   Problem: Education: Goal: Knowledge of [facility] Education information/materials will improve Outcome: Progressing Goal: Mental status will improve Outcome: Progressing Goal: Verbalization of understanding the information provided will improve Outcome: Progressing

## 2024-08-18 NOTE — Group Note (Signed)
 Date:  08/18/2024 Time:  3:57 PM  Group Topic/Focus: Motivation/Inspiration/Stages of Change  Stages of Change:   The focus of this group is to explain the stages of change and help patients identify changes they want to make upon discharge.    Participation Level:  Did Not Attend   Edward [name] 08/18/2024, 3:57 PM

## 2024-08-18 NOTE — Group Note (Signed)
 Recreation Therapy Group Note   Group Topic:Communication  Group Date: 08/18/2024 Start Time: 0935 End Time: 0956 Facilitators: Bowie Delia-McCall, LRT,CTRS Location: 300 Hall Dayroom   Group Topic: Communication, Team Building, Problem Solving  Goal Area(s) Addresses:  Patient will effectively work with peer towards shared goal.  Patient will identify skills used to make activity successful.  Patient will identify how skills used during activity can be applied to reach post d/c goals.   Behavioral Response:   Intervention: STEM Activity- Glass Blower/designer  Activity: Tallest [employer]. In teams of 5-6, patients were given 11 craft pipe cleaners. Using the materials provided, patients were instructed to compete again the opposing team(s) to build the tallest free-standing structure from floor level. The activity was timed; difficulty increased by clinical research associate as production designer, theatre/television/film continued.  Systematically resources were removed with additional directions for example, placing one arm behind their back, working in silence, and shape stipulations. LRT facilitated post-activity discussion reviewing team processes and necessary communication skills involved in completion. Patients were encouraged to reflect how the skills utilized, or not utilized, in this activity can be incorporated to positively impact support systems post discharge.  Education: Pharmacist, Community, Scientist, Physiological, Discharge Planning   Education Outcome: Acknowledges education/In group clarification offered/Needs additional education.    Affect/Mood: N/A   Participation Level: Did not attend    Clinical Observations/Individualized Feedback:      Plan: Continue to engage patient in RT group sessions 2-3x/week.   Edward [name], LRT,CTRS 08/18/2024 12:27 PM
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
## 2024-08-18 NOTE — Progress Notes (Signed)
 Patient presents: Patient presents with flat affect and depressed mood. Patient reports feeling miserable. Patient reports group does not help and did not attend recreation time. Patient remains cooperative in unit.    SI/HI/AVH: Denies   Plan: Denies   Groups attended: 0/6   Appetite: Adequate. Attended meals.   Sleep: No sleep disturbances reported.   PRNS: Hydroxyzine  po prn due to anxiety which provided relief.   Disturbances: No disturbances. Patient remains cooperative in milieu.    Questions/concerns: Patient had questions regarding his discharge planning. Patient also requested to speak with his child psychotherapist. Social worker notified.  VS: BP 127/63 (BP Location: Right Arm)   Pulse 68   Temp (!) 97.5 F (36.4 C) (Oral)   Resp 18   Ht 5' 11 (1.803 m)   Wt 86.8 kg   SpO2 99%   BMI 26.69 kg/m

## 2024-08-19 LAB — VITAMIN D 25 HYDROXY (VIT D DEFICIENCY, FRACTURES): Vit D, 25-Hydroxy: 20 ng/mL — ABNORMAL LOW (ref 30–100)

## 2024-08-19 MED ORDER — ARIPIPRAZOLE ER 400 MG IM SRER
400.0000 mg | INTRAMUSCULAR | Status: DC
  Administered 2024-08-20: 400 mg via INTRAMUSCULAR
  Filled 2024-08-19: qty 2

## 2024-08-19 NOTE — Group Note (Signed)
 Date:  08/19/2024 Time:  2:45 PM  Group Topic/Focus: Music therapy  Patients participated in [place], selecting songs of their choice within appropriate therapeutic boundaries. The activity promoted bonding and social connection among participants.    Participation Level:  Did Not Attend  Participation Quality:  N/A  Affect:  N/A  Cognitive:  N/A  Insight: None  Engagement in Group:  None  Modes of Intervention:  N/A  Additional Comments:  Pt did not attend this music therapy group  Kristi HERO [facility] 08/19/2024, 2:45 PM

## 2024-08-19 NOTE — Group Note (Signed)
 LCSW Group Therapy Note   Group Date: 08/19/2024 Start Time: 1100 End Time: 1200   Participation:  patient was present.  He listened and was respectful but didn't participate in the discussion.  Type of Therapy:  Group Therapy  Topic:  Speaking from the Heart: Communicating with Understanding and Empathy  Objective:  To help participants develop effective communication skills to express themselves clearly, listen actively, and navigate conflicts in a healthy way.  Goals: Increase awareness of verbal and non-verbal communication skills. Practice using I statements and active listening techniques. Learn coping strategies for managing communication stress.  Summary:  Participants explored the importance of communication, discussed challenges, and practiced skills such as active listening and assertive expression. They reflected on past experiences and identified ways to improve communication in their daily lives.  Therapeutic Modalities: -  Cognitive-Behavioral Therapy (CBT): Restructuring negative thought patterns in communication. -  Mindfulness: Staying present and calm during conversations. -  Psychoeducation: Learning about effective communication techniques.   Aalyssa Elderkin O Janaa Acero, LCSWA 08/19/2024  12:37 PM

## 2024-08-19 NOTE — Group Note (Signed)
 Occupational Therapy Group Note  Group Topic:Coping Skills  Group Date: 08/19/2024 Start Time: 1500 End Time: 1530 Facilitators: Dot [place] MATSU, OT   Group Description: Group encouraged increased engagement and participation through discussion and activity focused on Coping Ahead. Patients were split up into teams and selected a card from a stack of positive coping strategies. Patients were instructed to act out/charade the coping skill for other peers to guess and receive points for their team. Discussion followed with a focus on identifying additional positive coping strategies and patients shared how they were going to cope ahead over the weekend while continuing hospitalization stay.  Therapeutic Goal(s): Identify positive vs negative coping strategies. Identify coping skills to be used during hospitalization vs coping skills outside of hospital/at home Increase participation in therapeutic group environment and promote engagement in treatment   Participation Level: Engaged   Participation Quality: Independent   Behavior: Appropriate   Speech/Thought Process: Relevant   Affect/Mood: Euthymic   Insight: Fair   Judgement: Fair      Modes of Intervention: Education  Patient Response to Interventions:  Attentive   Plan: Continue to engage patient in OT groups 2 - 3x/week.  08/19/2024  [place] MATSU Dot, OT  Tristine Langi, OT

## 2024-08-19 NOTE — Progress Notes (Signed)
 [facility] MD Progress Note  08/19/2024 5:41 PM Edward [name]  MRN:  [id number] Principal Problem: Bipolar 1 disorder (HCC) Diagnosis: Principal Problem:   Bipolar 1 disorder (HCC)   Reason for Admission: Edward [name] is a 23 year old male with a history of bipolar disorder, attention-deficit disorder, and oppositional defiant disorder who was brought to [facility] by EMS after expressing suicidal ideation via text to his therapist, stating intent to overdose on medications. On arrival, he was reportedly combative and unable to engage, requiring restraints and administration of ziprasidone , and was placed under involuntary commitment. After medical clearance and psychiatric evaluation, he was admitted to the [facility] for safety and stabilization.   Daily Note: Patient is seen. Chart reviewed. The chart findings discussed with the treatment team. He presents alert, oriented & aware of situation. She is visible on the unit, not attending group sessions. He is seen most of the time pacing angrily within the unit. He presents with sad/depressed affect. He is making a fair contact. He reports, I have been her for 3 days because my therapy arranged all of this by calling 911 because I told her I was feeling suicidal. Well I'm here like I was on two previous occasions. I try to stay positive here, but there is nothing to make me feel positive in this hospital. I don't like the people I'm around with. I do not like the group sessions because music therapy does not do anything for me. I don't understand why I have to be happy before I can be discharged from here. What does me being happy has to do with my discharge. I am trying very hard to be positive, but I can't. There is nothing to be positive about in this environment. Edward [name] currently denies any SIHI, AVH, delusional thoughts or paranoia. However, he remains highly irritated & angry. He has agreed to start the Abilify   maintaina tomorrow 08-10-24. Hopefully this will help in re-stabilizing his mood. See the plan of care below. Vital signs , stable.  Total Time spent with patient: 35 minutes  Past Psychiatric History: See H&P  Past Medical History:  Past Medical History:  Diagnosis Date   ADHD    Bipolar 1 disorder (HCC)    Intellectual developmental disorder, mild    ODD (oppositional defiant disorder)    Vitamin D  insufficiency 08/30/2023   History reviewed. No pertinent surgical history.  Family History: History reviewed. No pertinent family history.  Family Psychiatric  History: See H&P  Social History:  Social History   Substance and Sexual Activity  Alcohol Use No     Social History   Substance and Sexual Activity  Drug Use No    Social History   Socioeconomic History   Marital status: Single    Spouse name: Not on file   Number of children: Not on file   Years of education: Not on file   Highest education level: Not on file  Occupational History   Not on file  Tobacco Use   Smoking status: Never   Smokeless tobacco: Never  Substance and Sexual Activity   Alcohol use: No   Drug use: No   Sexual activity: Not on file  Other Topics Concern   Not on file  Social History Narrative   Not on file   Social Drivers of Health   Tobacco Use: Low Risk (08/17/2024)   Patient History    Smoking Tobacco Use: Never    Smokeless Tobacco Use: Never  Passive Exposure: Not on file  Financial Resource Strain: Not on file  Food Insecurity: No Food Insecurity (08/16/2024)   Epic    Worried About Programme Researcher, Broadcasting/film/video in the Last Year: Never true    Ran Out of Food in the Last Year: Never true  Recent Concern: Food Insecurity - Food Insecurity Present (05/24/2024)   Epic    Worried About Programme Researcher, Broadcasting/film/video in the Last Year: Sometimes true    Ran Out of Food in the Last Year: Sometimes true  Transportation Needs: Unmet Transportation Needs (08/16/2024)   Epic    Lack of  Transportation (Medical): Yes    Lack of Transportation (Non-Medical): No  Physical Activity: Not on file  Stress: Not on file  Social Connections: Unknown (01/15/2022)   Received from [facility]   Social Network    Social Network: Not on file  Depression (PHQ2-9): High Risk (06/16/2024)   Depression (PHQ2-9)    PHQ-2 Score: 16  Alcohol Screen: Low Risk (08/16/2024)   Alcohol Screen    Last Alcohol Screening Score (AUDIT): 0  Housing: Low Risk (08/16/2024)   Epic    Unable to Pay for Housing in the Last Year: No    Number of Times Moved in the Last Year: 0    Homeless in the Last Year: No  Recent Concern: Housing - High Risk (05/24/2024)   Epic    Unable to Pay for Housing in the Last Year: Yes    Number of Times Moved in the Last Year: 2    Homeless in the Last Year: Patient declined  Utilities: Not At Risk (08/16/2024)   Epic    Threatened with loss of utilities: No  Health Literacy: Not on file   Additional Social History:   Current Medications: Current Facility-Administered Medications  Medication Dose Route Frequency Provider Last Rate Last Admin   acetaminophen  (TYLENOL ) tablet 650 mg  650 mg Oral Q6H PRN Motley-Mangrum, Jadeka A, PMHNP       alum & mag hydroxide-simeth (MAALOX/MYLANTA) 200-200-20 MG/5ML suspension 30 mL  30 mL Oral Q4H PRN Motley-Mangrum, Jadeka A, PMHNP       ARIPiprazole  (ABILIFY ) tablet 15 mg  15 mg Oral Daily Bennett, Christal H, NP   15 mg at 08/19/24 0958   [START ON 08/20/2024] ARIPiprazole  ER (ABILIFY  MAINTENA) injection 400 mg  400 mg Intramuscular Q28 days Torren Maffeo, Mac I, NP       haloperidol  (HALDOL ) tablet 5 mg  5 mg Oral TID PRN Motley-Mangrum, Jadeka A, PMHNP   5 mg at 08/17/24 1653   And   diphenhydrAMINE  (BENADRYL ) capsule 50 mg  50 mg Oral TID PRN Motley-Mangrum, Jadeka A, PMHNP   50 mg at 08/17/24 1653   haloperidol  lactate (HALDOL ) injection 5 mg  5 mg Intramuscular TID PRN Motley-Mangrum, Jadeka A, PMHNP       And    diphenhydrAMINE  (BENADRYL ) injection 50 mg  50 mg Intramuscular TID PRN Motley-Mangrum, Jadeka A, PMHNP       And   LORazepam  (ATIVAN ) injection 2 mg  2 mg Intramuscular TID PRN Motley-Mangrum, Jadeka A, PMHNP       haloperidol  lactate (HALDOL ) injection 10 mg  10 mg Intramuscular TID PRN Motley-Mangrum, Jadeka A, PMHNP       And   diphenhydrAMINE  (BENADRYL ) injection 50 mg  50 mg Intramuscular TID PRN Motley-Mangrum, Jadeka A, PMHNP       And   LORazepam  (ATIVAN ) injection 2 mg  2 mg Intramuscular TID  PRN Motley-Mangrum, Jadeka A, PMHNP       hydrOXYzine  (ATARAX ) tablet 25 mg  25 mg Oral TID PRN Motley-Mangrum, Jadeka A, PMHNP   25 mg at 08/19/24 1200   magnesium  hydroxide (MILK OF MAGNESIA) suspension 30 mL  30 mL Oral Daily PRN Motley-Mangrum, Jadeka A, PMHNP        Lab Results:  Results for orders placed or performed during the hospital encounter of 08/16/24 (from the past 48 hours)  VITAMIN D  25 Hydroxy (Vit-D Deficiency, Fractures)     Status: Abnormal   Collection Time: 08/18/24  6:18 PM  Result Value Ref Range   Vit D, 25-Hydroxy 20.0 (L) 30 - 100 ng/mL    Comment: (NOTE) Vitamin D  deficiency has been defined by the Institute of Medicine  and an Endocrine Society practice guideline as a level of serum 25-OH  vitamin D  less than 20 ng/mL (1,2). The Endocrine Society went on to  further define vitamin D  insufficiency as a level between 21 and 29  ng/mL (2).  1. IOM (Institute of Medicine). 2010. Dietary reference intakes for  calcium and D. [place]: [employer]. 2. Holick MF, Binkley Section, Bischoff-Ferrari HA, et al. Evaluation,  treatment, and prevention of vitamin D  deficiency: an Endocrine  Society clinical practice guideline, JCEM. 2011 Jul; 96(7): 1911-30.  Performed at [facility], [address], [place], [place] [address]   Basic metabolic panel     Status: None   Collection Time: 08/18/24  6:18 PM  Result Value Ref Range   Sodium 142 135  - 145 mmol/L   Potassium 4.0 3.5 - 5.1 mmol/L   Chloride 103 98 - 111 mmol/L   CO2 31 22 - 32 mmol/L   Glucose, Bld 83 70 - 99 mg/dL    Comment: Glucose reference range applies only to samples taken after fasting for at least 8 hours.   BUN 12 6 - 20 mg/dL   Creatinine, Ser 8.99 0.61 - 1.24 mg/dL   Calcium 9.8 8.9 - 89.6 mg/dL   GFR, Estimated >39 >39 mL/min    Comment: (NOTE) Calculated using the CKD-EPI Creatinine Equation (2021)    Anion gap 8 5 - 15    Comment: Performed at [facility], [address] [place], [place] [address]    Blood Alcohol level:  Lab Results  Component Value Date   [facility] <15 08/14/2024   ETH <15 05/22/2024    Metabolic Disorder Labs: Lab Results  Component Value Date   HGBA1C 5.2 05/25/2024   MPG 102.54 05/25/2024   MPG 111 08/29/2023   No results found for: PROLACTIN Lab Results  Component Value Date   CHOL 174 05/25/2024   TRIG 85 05/25/2024   HDL 40 (L) 05/25/2024   CHOLHDL 4.4 05/25/2024   VLDL 17 05/25/2024   LDLCALC 117 (H) 05/25/2024   LDLCALC 70 08/29/2023    Physical Findings: AIMS:  ,  ,  ,  ,  ,  ,   CIWA:   N/A COWS:   N/A  Musculoskeletal: Strength & Muscle Tone: within normal limits Gait & Station: normal Patient leans: N/A  Psychiatric Specialty Exam:  Presentation  General Appearance:  Casual; Disheveled  Eye Contact: Fair  Speech: Clear and Coherent; Normal Rate  Speech Volume: Normal  Handedness: Right   Mood and Affect  Mood: Depressed; Irritable  Affect: Congruent   Thought Process  Thought Processes: Coherent  Descriptions of Associations:Intact  Orientation:Full (Time, Place and  Person)  Thought Content:Logical  History of Schizophrenia/Schizoaffective disorder:No data recorded Duration of Psychotic Symptoms:No data recorded Hallucinations:Hallucinations: None   Ideas of Reference:None  Suicidal Thoughts:Suicidal Thoughts: No   Homicidal  Thoughts:Homicidal Thoughts: No    Sensorium  Memory: Immediate Good; Recent Good; Remote Good  Judgment: Poor  Insight: Lacking   Executive Functions  Concentration: Fair  Attention Span: Fair  Recall: Good  Fund of Knowledge: Poor  Language: Fair   Psychomotor Activity  Psychomotor Activity: Psychomotor Activity: Normal    Assets  Assets: Resilience; Social Support; Housing   Sleep  Sleep: Sleep: Good Number of Hours of Sleep: 7.5    Physical Exam: Physical Exam Vitals and nursing note reviewed.  Constitutional:      General: He is not in acute distress.    Appearance: He is not ill-appearing.  HENT:     Head: Normocephalic.     Mouth/Throat:     Pharynx: Oropharynx is clear.  Pulmonary:     Effort: No respiratory distress.  Musculoskeletal:        General: Normal range of motion.  Skin:    General: Skin is dry.  Neurological:     Mental Status: He is alert and oriented to person, place, and time.    Review of Systems  Psychiatric/Behavioral:  Positive for depression and suicidal ideas. Negative for hallucinations and substance abuse. The patient is nervous/anxious. The patient does not have insomnia.   All other systems reviewed and are negative.  Blood pressure 118/72, pulse (!) 55, temperature 97.6 F (36.4 C), temperature source Oral, resp. rate 16, height 5' 11 (1.803 m), weight 86.8 kg, SpO2 100%. Body mass index is 26.69 kg/m.  Treatment Plan Summary: Daily contact with patient to assess and evaluate symptoms and progress in treatment and Medication management   Diagnoses / Active Problems:  Principal Problem:   Bipolar 1 disorder (HCC)    PLAN: Safety and Monitoring:             -- Involuntary (Will uphold) admission to inpatient psychiatric unit for safety, stabilization and treatment             -- Daily contact with patient to assess and evaluate symptoms and progress in treatment             -- Patient's case to  be discussed in multi-disciplinary team meeting             -- Observation Level: q15 minute checks             -- Vital signs:  q12 hours             -- Precautions: suicide, elopement, and assault   2. Psychiatric Diagnoses and Treatment:               # Bipolar disorder   -- Continue Abilify  15 mg oral daily.  -- Initiated Abilify  maintena 400 mg po Q 28 days starting 08-20-24. -- Hydroxyzine  25 mg oral, 3 times daily as needed, anxiety             -- Haldol  BH Agitation Protocol (See MAR)               -- Re-referral to ACT Team services if eligible             -- Arrange mail delivery of medications at discharge              3. Medical Issues Being Addressed:  N/A   4. Labs  -- CBC: Unremarkable             -- CMP: Potassium 3.0 (received potassium supplementation in the ED.; will recheck potassium tomorrow morning), Blood glucose 127, Total bilirubin 1.3, Anion gap 17, otherwise WNL             -- Ethanol: <15             -- Lipid Panel (05/25/2024): HDL 40, LDL Cholesterol 117, otherwise WNL             -- HgBA1c (05/25/2024): WNL             -- UDS: Unremarkable             -- QT/QTc 398/394    -- The risks/benefits/side-effects/alternatives to this medication were discussed in detail with the patient and time was given for questions. The patient consents to medication trial.  -- FDA -- Metabolic profile and EKG monitoring obtained while on an atypical antipsychotic (BMI: Lipid Panel: HbgA1c: QTc:)               -- Encouraged patient to participate in unit milieu and in scheduled group therapies  -- Short Term Goals: Ability to identify changes in lifestyle to reduce recurrence of condition will improve, Ability to verbalize feelings will improve, Ability to disclose and discuss suicidal ideas, Ability to demonstrate self-control will improve, Ability to identify and develop effective coping behaviors will improve, Ability to maintain clinical measurements within normal  limits will improve, Compliance with prescribed medications will improve, and Ability to identify triggers associated with substance abuse/mental health issues will improve             -- Long Term Goals: Improvement in symptoms so as ready for discharge     5. Discharge Planning:  -- Social work and case management to assist with discharge planning and identification of hospital follow-up needs prior to discharge -- Estimated LOS: 5-7 days -- Discharge Concerns: Need to establish a safety plan; Medication compliance and effectiveness -- Discharge Goals: Return home with outpatient referrals for mental health follow-up including medication management/psychotherapy    I certify that inpatient services furnished can reasonably be expected to improve the patient's condition.    Mac Bolster, NP, pmhnp, fnp-bc. 08/19/2024, 5:41 PM Patient ID: Edward [name], male   DOB: [birth year], 23 y.o.   MRN: [id number]

## 2024-08-19 NOTE — Plan of Care (Signed)
   Problem: Coping: Goal: Ability to verbalize frustrations and anger appropriately will improve Outcome: Progressing Goal: Ability to demonstrate self-control will improve Outcome: Progressing

## 2024-08-19 NOTE — [facility] Group Notes (Signed)
 Pt partially attended the OT group today, 08/19/24 ([id number])

## 2024-08-19 NOTE — Group Note (Signed)
 Date:  08/19/2024 Time:  9:28 PM  Group Topic/Focus:  Wrap-Up Group:   The focus of this group is to help patients review their daily goal of treatment and discuss progress on daily workbooks.    Participation Level:  Did Not Attend  Participation Quality:  Patient did not attend group!   Affect:  Patient did not attend group!   Cognitive:  Patient did not attend group!  Insight: Patient did not attend group!  Engagement in Group:  Patient did not attend group!  Modes of Intervention:  Patient did not attend group!  Additional Comments:  Patient did not attend group! MHT will continue to encourage active participation.  Dena JINNY Mace 08/19/2024, 9:28 PM

## 2024-08-19 NOTE — [facility] Group Notes (Signed)
 Pt attended the CSW group today, 08/19/24 ([id number])
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
## 2024-08-19 NOTE — Progress Notes (Signed)
 D Patient presents with flat affect, depressed mood- reported sleeping well last night, described his appetite and concentration as 'good' and energy level as 'normal'. Per pt's self inventory, pt rated his depression,hopelessness and anxiety all 6/10. Pt did not attend groups, despite encouragement from staff, pt stating, they're not going to help me.  A. Labs and vitals monitored. Pt given hydroxyzine  for complaints of anxiety. Pt supported emotionally and encouraged to express concerns and ask questions.   R. Pt remains safe with 15 minute checks. Will continue POC.

## 2024-08-19 NOTE — [facility] Group Notes (Signed)
 Pt did not attend the nutrition group today, 08/19/24 ([id number])

## 2024-08-19 NOTE — Group Note (Signed)
 Date:  08/19/2024 Time:  10:01 AM  Group Topic/Focus: Goals group  Patients were given two worksheets: a goals worksheet and a list of 50 positive traits. Patients participated in an icebreaker by sharing their name, a desired Christmas gift, and identifying positive traits they align with. They were encouraged to share their responses and goals with the group. The group focused on promoting expanded thinking, social interaction, and positivity.    Participation Level:  Did Not Attend  Participation Quality:  N/A  Affect:  N/A  Cognitive:  N/A  Insight: None  Engagement in Group:  N/A  Modes of Intervention:  N/A  Additional Comments:  Pt did not attend goals group.  Edward [name] 08/19/2024, 10:01 AM

## 2024-08-20 NOTE — Progress Notes (Signed)
 [facility] MD Progress Note  08/20/2024 3:09 PM Edward [name]  MRN:  [id number] Principal Problem: Bipolar 1 disorder (HCC) Diagnosis: Principal Problem:   Bipolar 1 disorder (HCC)   Reason for Admission: Edward [name] Edward [name] Edward [name] is a 23 year old male with a history of bipolar disorder, attention-deficit disorder, and oppositional defiant disorder who was brought to [facility] by EMS after expressing suicidal ideation via text to his therapist, stating intent to overdose on medications. On arrival, he was reportedly combative and unable to engage, requiring restraints and administration of ziprasidone , and was placed under involuntary commitment. After medical clearance and psychiatric evaluation, he was admitted to the [facility] for safety and stabilization.   Daily Note:  Patient is seen. Chart reviewed. The chart findings discussed with the treatment team. He presents alert, oriented & aware of situation. He is visible on the unit, not attending group sessions. He is seen outside of his room today, however he is not attending group sessions. He presents with a restricted affect. He is making a fair eye contact still. He reports, My mood is neutral today. I slept well last night. I received the abilify  injection today. I have no side effects. I don't feel depressed or anxious. Edward [name] currently denies any SIHI, AVH, delusional thoughts or paranoia. He does not appear to be responding to any internal stimuli.  See the plan of care below. Vital signs , stable.  This provider did reach out to patient's mother, Edward [name] via the phone #: [phone] to update her on Edward [name] current status/progress. The phone call is also to inform her that patient seems to be stable enough for discharge as he has received a dose of his Abilify  maintena today. She was unable to answer her phone. Left her a message.    Total Time spent with patient: 35 minutes  Past Psychiatric History: See  H&P  Past Medical History:  Past Medical History:  Diagnosis Date   ADHD    Bipolar 1 disorder (HCC)    Intellectual developmental disorder, mild    ODD (oppositional defiant disorder)    Vitamin D  insufficiency 08/30/2023   History reviewed. No pertinent surgical history.  Family History: History reviewed. No pertinent family history.  Family Psychiatric  History: See H&P  Social History:  Social History   Substance and Sexual Activity  Alcohol Use No     Social History   Substance and Sexual Activity  Drug Use No    Social History   Socioeconomic History   Marital status: Single    Spouse name: Not on file   Number of children: Not on file   Years of education: Not on file   Highest education level: Not on file  Occupational History   Not on file  Tobacco Use   Smoking status: Never   Smokeless tobacco: Never  Substance and Sexual Activity   Alcohol use: No   Drug use: No   Sexual activity: Not on file  Other Topics Concern   Not on file  Social History Narrative   Not on file   Social Drivers of Health   Tobacco Use: Low Risk (08/17/2024)   Patient History    Smoking Tobacco Use: Never    Smokeless Tobacco Use: Never    Passive Exposure: Not on file  Financial Resource Strain: Not on file  Food Insecurity: No Food Insecurity (08/16/2024)   Epic    Worried About Radiation Protection Practitioner of Food in the Last Year: Never true  Ran Out of Food in the Last Year: Never true  Recent Concern: Food Insecurity - Food Insecurity Present (05/24/2024)   Epic    Worried About Programme Researcher, Broadcasting/film/video in the Last Year: Sometimes true    Ran Out of Food in the Last Year: Sometimes true  Transportation Needs: Unmet Transportation Needs (08/16/2024)   Epic    Lack of Transportation (Medical): Yes    Lack of Transportation (Non-Medical): No  Physical Activity: Not on file  Stress: Not on file  Social Connections: Unknown (01/15/2022)   Received from [facility]   Social  Network    Social Network: Not on file  Depression (PHQ2-9): High Risk (06/16/2024)   Depression (PHQ2-9)    PHQ-2 Score: 16  Alcohol Screen: Low Risk (08/16/2024)   Alcohol Screen    Last Alcohol Screening Score (AUDIT): 0  Housing: Low Risk (08/16/2024)   Epic    Unable to Pay for Housing in the Last Year: No    Number of Times Moved in the Last Year: 0    Homeless in the Last Year: No  Recent Concern: Housing - High Risk (05/24/2024)   Epic    Unable to Pay for Housing in the Last Year: Yes    Number of Times Moved in the Last Year: 2    Homeless in the Last Year: Patient declined  Utilities: Not At Risk (08/16/2024)   Epic    Threatened with loss of utilities: No  Health Literacy: Not on file   Additional Social History:   Current Medications: Current Facility-Administered Medications  Medication Dose Route Frequency Provider Last Rate Last Admin   acetaminophen  (TYLENOL ) tablet 650 mg  650 mg Oral Q6H PRN Motley-Mangrum, Jadeka A, PMHNP       alum & mag hydroxide-simeth (MAALOX/MYLANTA) 200-200-20 MG/5ML suspension 30 mL  30 mL Oral Q4H PRN Motley-Mangrum, Jadeka A, PMHNP       ARIPiprazole  (ABILIFY ) tablet 15 mg  15 mg Oral Daily Bennett, Christal H, NP   15 mg at 08/20/24 9056   ARIPiprazole  ER (ABILIFY  MAINTENA) injection 400 mg  400 mg Intramuscular Q28 days Collene Gouge I, NP   400 mg at 08/20/24 9046   haloperidol  (HALDOL ) tablet 5 mg  5 mg Oral TID PRN Motley-Mangrum, Jadeka A, PMHNP   5 mg at 08/17/24 1653   And   diphenhydrAMINE  (BENADRYL ) capsule 50 mg  50 mg Oral TID PRN Motley-Mangrum, Jadeka A, PMHNP   50 mg at 08/17/24 1653   haloperidol  lactate (HALDOL ) injection 5 mg  5 mg Intramuscular TID PRN Motley-Mangrum, Jadeka A, PMHNP       And   diphenhydrAMINE  (BENADRYL ) injection 50 mg  50 mg Intramuscular TID PRN Motley-Mangrum, Jadeka A, PMHNP       And   LORazepam  (ATIVAN ) injection 2 mg  2 mg Intramuscular TID PRN Motley-Mangrum, Jadeka A, PMHNP        haloperidol  lactate (HALDOL ) injection 10 mg  10 mg Intramuscular TID PRN Motley-Mangrum, Jadeka A, PMHNP       And   diphenhydrAMINE  (BENADRYL ) injection 50 mg  50 mg Intramuscular TID PRN Motley-Mangrum, Jadeka A, PMHNP       And   LORazepam  (ATIVAN ) injection 2 mg  2 mg Intramuscular TID PRN Motley-Mangrum, Jadeka A, PMHNP       hydrOXYzine  (ATARAX ) tablet 25 mg  25 mg Oral TID PRN Motley-Mangrum, Jadeka A, PMHNP   25 mg at 08/19/24 2123   magnesium  hydroxide (MILK OF MAGNESIA)  suspension 30 mL  30 mL Oral Daily PRN Motley-Mangrum, Jadeka A, PMHNP        Lab Results:  Results for orders placed or performed during the hospital encounter of 08/16/24 (from the past 48 hours)  VITAMIN D  25 Hydroxy (Vit-D Deficiency, Fractures)     Status: Abnormal   Collection Time: 08/18/24  6:18 PM  Result Value Ref Range   Vit D, 25-Hydroxy 20.0 (L) 30 - 100 ng/mL    Comment: (NOTE) Vitamin D  deficiency has been defined by the Institute of Medicine  and an Endocrine Society practice guideline as a level of serum 25-OH  vitamin D  less than 20 ng/mL (1,2). The Endocrine Society went on to  further define vitamin D  insufficiency as a level between 21 and 29  ng/mL (2).  1. IOM (Institute of Medicine). 2010. Dietary reference intakes for  calcium and D. [place]: [employer]. 2. Holick MF, Binkley [place], Bischoff-Ferrari HA, et al. Evaluation,  treatment, and prevention of vitamin D  deficiency: an Endocrine  Society clinical practice guideline, JCEM. 2011 Jul; 96(7): 1911-30.  Performed at [facility], [address] [place], [place] [address]   Basic metabolic panel     Status: None   Collection Time: 08/18/24  6:18 PM  Result Value Ref Range   Sodium 142 135 - 145 mmol/L   Potassium 4.0 3.5 - 5.1 mmol/L   Chloride 103 98 - 111 mmol/L   CO2 31 22 - 32 mmol/L   Glucose, Bld 83 70 - 99 mg/dL    Comment: Glucose reference range applies only to samples taken after fasting  for at least 8 hours.   BUN 12 6 - 20 mg/dL   Creatinine, Ser 8.99 0.61 - 1.24 mg/dL   Calcium 9.8 8.9 - 89.6 mg/dL   GFR, Estimated >39 >39 mL/min    Comment: (NOTE) Calculated using the CKD-EPI Creatinine Equation (2021)    Anion gap 8 5 - 15    Comment: Performed at [facility], [address], [place], [place] [address]    Blood Alcohol level:  Lab Results  Component Value Date   [facility] <15 08/14/2024   ETH <15 05/22/2024    Metabolic Disorder Labs: Lab Results  Component Value Date   HGBA1C 5.2 05/25/2024   MPG 102.54 05/25/2024   MPG 111 08/29/2023   No results found for: PROLACTIN Lab Results  Component Value Date   CHOL 174 05/25/2024   TRIG 85 05/25/2024   HDL 40 (L) 05/25/2024   CHOLHDL 4.4 05/25/2024   VLDL 17 05/25/2024   LDLCALC 117 (H) 05/25/2024   LDLCALC 70 08/29/2023   Physical Findings: AIMS:  ,  ,  ,  ,  ,  ,   CIWA:   N/A COWS:   N/A  Musculoskeletal: Strength & Muscle Tone: within normal limits Gait & Station: normal Patient leans: N/A  Psychiatric Specialty Exam:  Presentation  General Appearance:  Casual; Disheveled  Eye Contact: Fair  Speech: Clear and Coherent; Normal Rate  Speech Volume: Normal  Handedness: Right   Mood and Affect  Mood: Depressed; Irritable  Affect: Congruent  Thought Process  Thought Processes: Coherent  Descriptions of Associations:Intact  Orientation:Full (Time, Place and Person)  Thought Content:Logical  History of Schizophrenia/Schizoaffective disorder: NA  Duration of Psychotic Symptoms: NA  Hallucinations:Hallucinations: None   Ideas of Reference:None  Suicidal Thoughts:Suicidal Thoughts: No   Homicidal Thoughts:Homicidal Thoughts: No  Sensorium  Memory: Immediate Good; Recent  Good; Remote Good  Judgment: Poor  Insight: Lacking   Executive Functions  Concentration: Fair  Attention Span: Fair  Recall: Good  Fund of  Knowledge: Poor  Language: Fair  Psychomotor Activity  Psychomotor Activity: Psychomotor Activity: Normal  Assets  Assets: Resilience; Social Support; Housing  Sleep  Sleep: Sleep: Good Number of Hours of Sleep: 7.5  Physical Exam: Physical Exam Vitals and nursing note reviewed.  Constitutional:      General: He is not in acute distress.    Appearance: He is not ill-appearing.  HENT:     Head: Normocephalic.     Mouth/Throat:     Pharynx: Oropharynx is clear.  Pulmonary:     Effort: No respiratory distress.  Musculoskeletal:        General: Normal range of motion.  Skin:    General: Skin is dry.  Neurological:     Mental Status: He is alert and oriented to person, place, and time.    Review of Systems  Psychiatric/Behavioral:  Positive for depression and suicidal ideas. Negative for hallucinations and substance abuse. The patient is nervous/anxious. The patient does not have insomnia.   All other systems reviewed and are negative.  Blood pressure 114/62, pulse 64, temperature (!) 97.4 F (36.3 C), temperature source Oral, resp. rate 16, height 5' 11 (1.803 m), weight 86.8 kg, SpO2 99%. Body mass index is 26.69 kg/m.  Treatment Plan Summary: Daily contact with patient to assess and evaluate symptoms and progress in treatment and Medication management  Diagnoses / Active Problems:  Principal Problem:   Bipolar 1 disorder (HCC)    PLAN: Safety and Monitoring:             -- Involuntary (Will uphold) admission to inpatient psychiatric unit for safety, stabilization and treatment             -- Daily contact with patient to assess and evaluate symptoms and progress in treatment             -- Patient's case to be discussed in multi-disciplinary team meeting             -- Observation Level: q15 minute checks             -- Vital signs:  q12 hours             -- Precautions: suicide, elopement, and assault   2. Psychiatric Diagnoses and Treatment:                # Bipolar disorder   -- Continue Abilify  15 mg oral daily.  -- Continue Abilify  maintena 400 mg IM Q 28 days starting 08-20-24. -- Continue Hydroxyzine  25 mg oral, 3 times daily as needed, anxiety             -- Haldol  BH Agitation Protocol (See MAR)               -- Re-referral to ACT Team services if eligible             -- Arrange mail delivery of medications at discharge              3. Medical Issues Being Addressed:         N/A   4. Labs  -- CBC: Unremarkable             -- CMP: Potassium 3.0 (received potassium supplementation in the ED.; will recheck potassium tomorrow morning), Blood glucose 127, Total bilirubin 1.3, Anion gap 17, otherwise  WNL             -- Ethanol: <15             -- Lipid Panel (05/25/2024): HDL 40, LDL Cholesterol 117, otherwise WNL             -- HgBA1c (05/25/2024): WNL             -- UDS: Unremarkable             -- QT/QTc 398/394    -- The risks/benefits/side-effects/alternatives to this medication were discussed in detail with the patient and time was given for questions. The patient consents to medication trial.  -- FDA -- Metabolic profile and EKG monitoring obtained while on an atypical antipsychotic (BMI: Lipid Panel: HbgA1c: QTc:)               -- Encouraged patient to participate in unit milieu and in scheduled group therapies  -- Short Term Goals: Ability to identify changes in lifestyle to reduce recurrence of condition will improve, Ability to verbalize feelings will improve, Ability to disclose and discuss suicidal ideas, Ability to demonstrate self-control will improve, Ability to identify and develop effective coping behaviors will improve, Ability to maintain clinical measurements within normal limits will improve, Compliance with prescribed medications will improve, and Ability to identify triggers associated with substance abuse/mental health issues will improve             -- Long Term Goals: Improvement in symptoms so as ready for  discharge     5. Discharge Planning:  -- Social work and case management to assist with discharge planning and identification of hospital follow-up needs prior to discharge -- Estimated LOS: 5-7 days -- Discharge Concerns: Need to establish a safety plan; Medication compliance and effectiveness -- Discharge Goals: Return home with outpatient referrals for mental health follow-up including medication management/psychotherapy    I certify that inpatient services furnished can reasonably be expected to improve the patient's condition.    Mac Bolster, NP, pmhnp, fnp-bc. 08/20/2024, 3:09 PM Patient ID: Edward [name], male   DOB: August 30, 2001, 23 y.o.   MRN: [id number] Patient ID: Edward [name], male   DOB: [birth year], 24 y.o.   MRN: [id number]

## 2024-08-20 NOTE — Plan of Care (Signed)
   Problem: Education: Goal: Knowledge of [facility] Education information/materials will improve Outcome: Progressing Goal: Emotional status will improve Outcome: Progressing Goal: Mental status will improve Outcome: Progressing Goal: Verbalization of understanding the information provided will improve Outcome: Progressing

## 2024-08-20 NOTE — Progress Notes (Signed)
(  Sleep Hours) - 9.25 (Any PRNs that were needed, meds refused, or side effects to meds)- hydroxyzine  (Any disturbances and when (visitation, over night)- none (Concerns raised by the patient)- none (SI/HI/AVH)- denies

## 2024-08-20 NOTE — Group Note (Signed)
 Recreation Therapy Group Note   Group Topic:Leisure Education  Group Date: 08/20/2024 Start Time: 0930 End Time: 1000 Facilitators: Jarriel Papillion-McCall, LRT,CTRS Location: [address] Dayroom   Group Topic: Leisure Education   Goal Area(s) Addresses:  Patient will successfully identify positive leisure and recreation activities.  Patient will acknowledge benefits of participation in healthy leisure activities post discharge.  Patient will actively work with peers toward a shared goal.   Behavioral Response:    Intervention: Cooperative Group Game    Activity: Keep It Contractor. In circle, patients were timed as they tossed a beach ball to each to each other to determine how long they could keep the ball in motion. If the ball came to a complete stop, the time would reset and the game started over. Patients were trying to beat the longest time of previous groups.    Education: Teacher, English As A Foreign Language, Leisure as Merchant Navy Officer, Programmer, Applications, Building Control Surveyor   Education Outcome: Acknowledges education/In group clarification offered/Needs additional education   Affect/Mood: N/A   Participation Level: Did not attend    Clinical Observations/Individualized Feedback:      Plan: Continue to engage patient in RT group sessions 2-3x/week.   Lillias Difrancesco-McCall, LRT,CTRS 08/20/2024 12:19 PM

## 2024-08-20 NOTE — Group Note (Signed)
 Date:  08/20/2024 Time:  9:30 AM  Group Topic/Focus:  Goals Group:   The focus of this group is to help patients establish daily goals to achieve during treatment and discuss how the patient can incorporate goal setting into their daily lives to aide in recovery. Orientation:   The focus of this group is to educate the patient on the purpose and policies of crisis stabilization and provide a format to answer questions about their admission.  The group details unit policies and expectations of patients while admitted.    Participation Level:  Did Not Attend    Jerusha Reising 08/20/2024, 9:30 AM

## 2024-08-20 NOTE — Group Note (Signed)
 Date:  08/20/2024 Time:  11:59 AM  Group Topic/Focus:  Group Topic/Focus:  Description: The Social Wellness group focused on identifying and creating healthy personal boundaries. Patients were educated on the purpose of boundaries, different types of boundaries (emotional, physical, verbal), and the importance of boundaries in maintaining healthy relationships and personal well-being. The group discussed examples of healthy versus unhealthy boundaries and practiced assertive communication through discussion and scenario-based examples. Patients were encouraged to reflect on their own boundary challenges and ways to set limits respectfully and safely.  Patient Participation/Response: Patients were attentive and engaged throughout the group. Several patients actively participated by sharing personal examples, writing on the white bored and asking questions. Overall participation was appropriate, and patients demonstrated understanding of the topic through discussion and responses.    Participation Level:  Did Not Attend  Edward [name] 08/20/2024, 11:59 AM

## 2024-08-20 NOTE — Progress Notes (Signed)
" °   08/20/24 0943  Psychosocial Assessment  Patient Complaints Irritability;Depression  Eye Contact Fair  Facial Expression Flat  Affect Sad;Flat  Speech Soft  Interaction Minimal  Motor Activity Other (Comment)  Appearance/Hygiene Disheveled  Behavior Characteristics Cooperative  Mood Depressed  Thought Process  Coherency WDL  Content WDL  Delusions None reported or observed  Perception WDL  Hallucination None reported or observed  Judgment Poor  Confusion None  Danger to Self  Current suicidal ideation? Denies  Self-Injurious Behavior No self-injurious ideation or behavior indicators observed or expressed   Agreement Not to Harm Self Yes  Description of Agreement Verbal  Danger to Others  Danger to Others None reported or observed    "

## 2024-08-20 NOTE — Group Note (Signed)
 Date:  08/20/2024 Time:  8:32 PM  Group Topic/Focus:  Neurotransmitters and their role in moods and behaviors. How psych meds affect them and for which symptoms. The gut flora's role with neurotransmitters.    Participation Level:  Did Not Attend   Juliene CHRISTELLA Huddle 08/20/2024, 8:32 PM

## 2024-08-20 NOTE — Group Note (Signed)
 Date:  08/20/2024 Time:  9:53 AM  Group Topic/Focus:  RECREATION THERAPY (KEEP IT GOING VOLLYBALL) Purpose: The Keep It Going Volleyball session was organized as a recreational group activity aimed at promoting physical movement, social interaction, and team-building in a supportive and structured environment. The activity serves as a tool to engage participants in an enjoyable and non-competitive setting, encouraging positive mental health through physical exercise, communication, and group participation.  Objectives:  Enhance Socialization: Provide a platform for participants to engage with peers in a relaxed and enjoyable setting, promoting healthy interpersonal interactions.  Promote Physical Activity: Encourage movement and physical exercise, which has been shown to help improve mood, reduce stress, and increase overall well-being.  Build Teamwork and Cooperation: Jerrye a agricultural engineer and cooperation among participants, helping them work together toward a common goal in a non-competitive manner.  Boost Mental Well-Being: Provide a low-pressure environment where participants can engage in fun and rewarding activity, which contributes to reducing anxiety and improving self-esteem.  Increase Engagement: Create an opportunity for participants to be actively engaged and present, reducing feelings of isolation and promoting a sense of belonging.  Activity Overview: The session involved a relaxed, non-competitive game of volleyball where participants worked together in teams. The focus was on having fun, training and development officer, and encouraging active participation. Rules were kept simple to ensure that everyone could take part regardless of skill level.  Facilitators provided gentle guidance and encouragement, ensuring that the activity remained inclusive and supportive. Emphasis was placed on fostering a positive atmosphere, with participants encouraged to celebrate each others efforts and  accomplishments, no matter how small.  Outcome/Results:  Participants showed increased engagement and enthusiasm throughout the activity.  Positive interactions were observed between participants, with moments of laughter, encouragement, and team bonding.  Physical activity appeared to contribute to an improvement in overall mood and energy levels.  Several participants expressed enjoyment and a desire to continue participating in future recreational activities.  Recommendations:  Continue incorporating recreational activities like volleyball into the schedule to promote group cohesion and mental well-being.  Explore further team-based or movement-focused activities to enhance social skills and emotional regulation.    Participation Level:  Did Not Attend Edward [name] 08/20/2024, 9:53 AM

## 2024-08-21 NOTE — Progress Notes (Signed)
 [facility] MD Progress Note  08/21/2024 4:00 PM Edward [name]  MRN:  [id number] Subjective:   Edward [name] is a 23 yr old male with a history of bipolar disorder, attention-deficit disorder, and oppositional defiant disorder who was brought to [facility] by EMS after expressing suicidal ideation via text to his therapist, stating intent to overdose on medications. On arrival, he was reportedly combative and unable to engage, requiring restraints and administration of ziprasidone , and was placed under involuntary commitment. After medical clearance and psychiatric evaluation, he was admitted to the [facility] for safety and stabilization.    Case was discussed in the multidisciplinary team. MAR was reviewed and patient was compliant with medications.  He reports PRN Hydroxyzine  x2 yesterday.   Psychiatric Team made the following recommendations yesterday: -- Continue Abilify  15 mg oral daily.  -- Continue Abilify  maintena 400 mg IM Q 28 days starting 08-20-24.   On interview today patient reports he slept good last night.  He reports his appetite is doing good.  He reports no SI, HI, or AVH.  He reports no Paranoia or Ideas of Reference.  He reports no issues with his medications.  He reports some soreness at his injection site.  He reports looking forward to discharge.  He reports no other concerns at present.   Called patient's Mother, Edward [name], [phone].  She reports that her main concern is him staying on his medications.  She reports that when he is not on his medications he can get aggressive/violent.  Discussed that we would continue to re-enforce the need to take his medication.  She reports no other concerns at present.   Principal Problem: Bipolar 1 disorder (HCC) Diagnosis: Principal Problem:   Bipolar 1 disorder (HCC)  Total Time spent with patient:  I personally spent 35 minutes on the unit in direct patient care. The direct patient care time included  face-to-face time with the patient, reviewing the patient's chart, communicating with other professionals, and coordinating care.    Past Psychiatric History:  Previous psychiatric diagnoses: Bipolar disorder, MDD, ADHD, ODD Prior inpatient treatment: Two prior psychiatric hospitalizations at this behavioral health hospital (December 2024 and September 2025) Prior rehab treatment: Denies History of suicide: One prior suicide attempt via intentional overdose on his psychotropic medications in September 2025 History of homicide: Denies Psychiatric medication history: Abilify  oral and Abilify  Maintena Psychiatric medication compliance history: Noncompliant due to running out of medication and lack of transportation Neuromodulation history: Denies Current psychiatrist: None established Current therapist: Weekly therapy sessions; reports inability to recall agency name; chart review indicates [facility]  Past Medical History:  Past Medical History:  Diagnosis Date   ADHD    Bipolar 1 disorder (HCC)    Intellectual developmental disorder, mild    ODD (oppositional defiant disorder)    Vitamin D  insufficiency 08/30/2023   History reviewed. No pertinent surgical history. Family History: History reviewed. No pertinent family history. Family Psychiatric  History:  Psychiatric: Older brother with schizophrenia per chart review Psychiatric medications: Unknown Suicide attempts/completed suicide: Denies Substance Use: Denies   Social History:  Social History   Substance and Sexual Activity  Alcohol Use No     Social History   Substance and Sexual Activity  Drug Use No    Social History   Socioeconomic History   Marital status: Single    Spouse name: Not on file   Number of children: Not on file   Years of education: Not on file  Highest education level: Not on file  Occupational History   Not on file  Tobacco Use   Smoking status: Never   Smokeless tobacco:  Never  Substance and Sexual Activity   Alcohol use: No   Drug use: No   Sexual activity: Not on file  Other Topics Concern   Not on file  Social History Narrative   Not on file   Social Drivers of Health   Tobacco Use: Low Risk (08/17/2024)   Patient History    Smoking Tobacco Use: Never    Smokeless Tobacco Use: Never    Passive Exposure: Not on file  Financial Resource Strain: Not on file  Food Insecurity: No Food Insecurity (08/16/2024)   Epic    Worried About Programme Researcher, Broadcasting/film/video in the Last Year: Never true    Ran Out of Food in the Last Year: Never true  Recent Concern: Food Insecurity - Food Insecurity Present (05/24/2024)   Epic    Worried About Programme Researcher, Broadcasting/film/video in the Last Year: Sometimes true    Ran Out of Food in the Last Year: Sometimes true  Transportation Needs: Unmet Transportation Needs (08/16/2024)   Epic    Lack of Transportation (Medical): Yes    Lack of Transportation (Non-Medical): No  Physical Activity: Not on file  Stress: Not on file  Social Connections: Unknown (01/15/2022)   Received from [facility]   Social Network    Social Network: Not on file  Depression (PHQ2-9): High Risk (06/16/2024)   Depression (PHQ2-9)    PHQ-2 Score: 16  Alcohol Screen: Low Risk (08/16/2024)   Alcohol Screen    Last Alcohol Screening Score (AUDIT): 0  Housing: Low Risk (08/16/2024)   Epic    Unable to Pay for Housing in the Last Year: No    Number of Times Moved in the Last Year: 0    Homeless in the Last Year: No  Recent Concern: Housing - High Risk (05/24/2024)   Epic    Unable to Pay for Housing in the Last Year: Yes    Number of Times Moved in the Last Year: 2    Homeless in the Last Year: Patient declined  Utilities: Not At Risk (08/16/2024)   Epic    Threatened with loss of utilities: No  Health Literacy: Not on file   Additional Social History:                         Sleep: Good Estimated Sleeping Duration (Last 24 Hours): 5.50-7.00  hours  Appetite:  Good  Current Medications: Current Facility-Administered Medications  Medication Dose Route Frequency Provider Last Rate Last Admin   acetaminophen  (TYLENOL ) tablet 650 mg  650 mg Oral Q6H PRN Motley-Mangrum, Jadeka A, PMHNP       alum & mag hydroxide-simeth (MAALOX/MYLANTA) 200-200-20 MG/5ML suspension 30 mL  30 mL Oral Q4H PRN Motley-Mangrum, Jadeka A, PMHNP       ARIPiprazole  (ABILIFY ) tablet 15 mg  15 mg Oral Daily Bennett, Christal H, NP   15 mg at 08/21/24 0920   ARIPiprazole  ER (ABILIFY  MAINTENA) injection 400 mg  400 mg Intramuscular Q28 days Collene Gouge I, NP   400 mg at 08/20/24 9046   haloperidol  (HALDOL ) tablet 5 mg  5 mg Oral TID PRN Motley-Mangrum, Jadeka A, PMHNP   5 mg at 08/21/24 0930   And   diphenhydrAMINE  (BENADRYL ) capsule 50 mg  50 mg Oral TID PRN Motley-Mangrum, Jadeka A, PMHNP  50 mg at 08/21/24 0930   haloperidol  lactate (HALDOL ) injection 5 mg  5 mg Intramuscular TID PRN Motley-Mangrum, Jadeka A, PMHNP       And   diphenhydrAMINE  (BENADRYL ) injection 50 mg  50 mg Intramuscular TID PRN Motley-Mangrum, Jadeka A, PMHNP       And   LORazepam  (ATIVAN ) injection 2 mg  2 mg Intramuscular TID PRN Motley-Mangrum, Jadeka A, PMHNP       haloperidol  lactate (HALDOL ) injection 10 mg  10 mg Intramuscular TID PRN Motley-Mangrum, Jadeka A, PMHNP       And   diphenhydrAMINE  (BENADRYL ) injection 50 mg  50 mg Intramuscular TID PRN Motley-Mangrum, Jadeka A, PMHNP       And   LORazepam  (ATIVAN ) injection 2 mg  2 mg Intramuscular TID PRN Motley-Mangrum, Jadeka A, PMHNP       hydrOXYzine  (ATARAX ) tablet 25 mg  25 mg Oral TID PRN Motley-Mangrum, Jadeka A, PMHNP   25 mg at 08/21/24 0920   magnesium  hydroxide (MILK OF MAGNESIA) suspension 30 mL  30 mL Oral Daily PRN Motley-Mangrum, Jadeka A, PMHNP        Lab Results: No results found for this or any previous visit (from the past 48 hours).  Blood Alcohol level:  Lab Results  Component Value Date   [facility] <15  08/14/2024   ETH <15 05/22/2024    Metabolic Disorder Labs: Lab Results  Component Value Date   HGBA1C 5.2 05/25/2024   MPG 102.54 05/25/2024   MPG 111 08/29/2023   No results found for: PROLACTIN Lab Results  Component Value Date   CHOL 174 05/25/2024   TRIG 85 05/25/2024   HDL 40 (L) 05/25/2024   CHOLHDL 4.4 05/25/2024   VLDL 17 05/25/2024   LDLCALC 117 (H) 05/25/2024   LDLCALC 70 08/29/2023    Physical Findings: AIMS:  ,  ,  ,  ,  ,  ,   CIWA:    COWS:     Musculoskeletal: Strength & Muscle Tone: within normal limits Gait & Station: normal Patient leans: N/A  Psychiatric Specialty Exam:  Presentation  General Appearance:  Casual  Eye Contact: Fair  Speech: Clear and Coherent  Speech Volume: Normal  Handedness: Right   Mood and Affect  Mood: Dysphoric  Affect: Constricted   Thought Process  Thought Processes: Goal Directed  Descriptions of Associations:Intact  Orientation:Full (Time, Place and Person)  Thought Content:Logical; WDL  History of Schizophrenia/Schizoaffective disorder:No data recorded Duration of Psychotic Symptoms:No data recorded Hallucinations:Hallucinations: None  Ideas of Reference:None  Suicidal Thoughts:Suicidal Thoughts: No  Homicidal Thoughts:Homicidal Thoughts: No   Sensorium  Memory: Immediate Fair; Recent Fair  Judgment: Fair  Insight: Present   Executive Functions  Concentration: Fair  Attention Span: Fair  Recall: [employer] of Knowledge: Fair  Language: Fair   Psychomotor Activity  Psychomotor Activity: Psychomotor Activity: Normal   Assets  Assets: Resilience; Social Support; Housing   Sleep  Sleep: Sleep: Good    Physical Exam: Physical Exam Vitals and nursing note reviewed.  Constitutional:      General: He is not in acute distress.    Appearance: Normal appearance. He is normal weight. He is not ill-appearing or toxic-appearing.  HENT:     Head:  Normocephalic and atraumatic.  Pulmonary:     Effort: Pulmonary effort is normal.  Musculoskeletal:        General: Normal range of motion.  Neurological:     General: No focal deficit present.  Mental Status: He is alert.    Review of Systems  Respiratory:  Negative for cough and shortness of breath.   Cardiovascular:  Negative for chest pain.  Gastrointestinal:  Negative for abdominal pain, constipation, diarrhea, nausea and vomiting.  Neurological:  Negative for dizziness, weakness and headaches.  Psychiatric/Behavioral:  Negative for depression, hallucinations and suicidal ideas. The patient is not nervous/anxious.    Blood pressure 125/74, pulse 63, temperature (!) 97.5 F (36.4 C), temperature source Oral, resp. rate 17, height 5' 11 (1.803 m), weight 86.8 kg, SpO2 100%. Body mass index is 26.69 kg/m.   Treatment Plan Summary: Daily contact with patient to assess and evaluate symptoms and progress in treatment and Medication management  Vivaan Helseth is a 23 yr old male with a history of bipolar disorder, attention-deficit disorder, and oppositional defiant disorder who was brought to [facility] by EMS after expressing suicidal ideation via text to his therapist, stating intent to overdose on medications. On arrival, he was reportedly combative and unable to engage, requiring restraints and administration of ziprasidone , and was placed under involuntary commitment. After medical clearance and psychiatric evaluation, he was admitted to the [facility] for safety and stabilization.    Rafiq received his Abilify  Maintena yesterday and so far has been tolerating it well.  Continue to stress to him the importance of staying on his medications after discharge.  If he continues to do well we will plan for discharge tomorrow.  We will not make any changes to his medications at this time.  We will continue to monitor.    Bipolar 1 disorder (HCC)     PLAN: Safety and Monitoring:             -- Involuntary (Will uphold) admission to inpatient psychiatric unit for safety, stabilization and treatment             -- Daily contact with patient to assess and evaluate symptoms and progress in treatment             -- Patient's case to be discussed in multi-disciplinary team meeting             -- Observation Level: q15 minute checks             -- Vital signs:  q12 hours             -- Precautions: suicide, elopement, and assault   2. Psychiatric Diagnoses and Treatment:               # Bipolar disorder   -- Continue Abilify  15 mg oral daily.  -- Received Abilify  maintena 400 mg IM 12/19 -- Continue Hydroxyzine  25 mg oral, 3 times daily as needed, anxiety             -- Haldol  BH Agitation Protocol (See MAR)               -- Re-referral to ACT Team services if eligible             -- Arrange mail delivery of medications at discharge              3. Medical Issues Being Addressed:         N/A   4. Labs  -- CBC: Unremarkable             -- CMP: Potassium 3.0 (received potassium supplementation in the ED.; will recheck potassium tomorrow morning), Blood glucose 127, Total bilirubin 1.3, Anion gap 17,  otherwise WNL             -- Ethanol: <15             -- Lipid Panel (05/25/2024): HDL 40, LDL Cholesterol 117, otherwise WNL             -- HgBA1c (05/25/2024): WNL             -- UDS: Unremarkable             -- QT/QTc 398/394    -- The risks/benefits/side-effects/alternatives to this medication were discussed in detail with the patient and time was given for questions. The patient consents to medication trial.  -- FDA -- Metabolic profile and EKG monitoring obtained while on an atypical antipsychotic (BMI: Lipid Panel: HbgA1c: QTc:)               -- Encouraged patient to participate in unit milieu and in scheduled group therapies  -- Short Term Goals: Ability to identify changes in lifestyle to reduce recurrence of condition will improve,  Ability to verbalize feelings will improve, Ability to disclose and discuss suicidal ideas, Ability to demonstrate self-control will improve, Ability to identify and develop effective coping behaviors will improve, Ability to maintain clinical measurements within normal limits will improve, Compliance with prescribed medications will improve, and Ability to identify triggers associated with substance abuse/mental health issues will improve             -- Long Term Goals: Improvement in symptoms so as ready for discharge     5. Discharge Planning:  -- Social work and case management to assist with discharge planning and identification of hospital follow-up needs prior to discharge -- Estimated LOS: 5-7 days -- Discharge Concerns: Need to establish a safety plan; Medication compliance and effectiveness -- Discharge Goals: Return home with outpatient referrals for mental health follow-up including medication management/psychotherapy     Marsa GORMAN Rosser, DO 08/21/2024, 4:00 PM

## 2024-08-21 NOTE — BHH Group Notes (Signed)
 LCSW Group Therapy Note  08/21/2024   10:00am-11:00am  Type of Therapy and Topic:  Group Therapy: Gratitude  Participation Level:  Did Not Attend   Description of Group:   In this group, patients shared and discussed the importance of acknowledging the elements in their lives for which they are grateful and how this can positively impact their mood.  The group discussed how bringing the positive elements of their lives to the forefront of their minds can help with recovery from any illness, physical or mental.  An exercise was done as a group in which a list was made of gratitude items in order to encourage participants to consider other potential positives in their lives.  Therapeutic Goals: Patients will discuss quotes about gratitude and explore how a change of attitude can make life more joyful. Patients will identify one or more items for which they are grateful in each of 6 categories:  people, experiences, things, places, skills, and other. Patients will discuss how it is possible to seek out gratitude in even bad situations. Patients will explore how the lack of gratitude can bring them down.   Summary of Patient Progress:  Patient was invited to group, did not attend.   Therapeutic Modalities:   Solution-Focused Therapy Activity  Mishika Flippen J Grossman-Orr, LCSW .

## 2024-08-21 NOTE — BHH Group Notes (Signed)
 Pt did not attend the CSW group today, 08/21/24 (1000-1100)

## 2024-08-21 NOTE — Plan of Care (Signed)
   Problem: Education: Goal: Knowledge of [place] [facility] Education information/materials will improve Outcome: Progressing Goal: Emotional status will improve Outcome: Progressing Goal: Mental status will improve Outcome: Progressing

## 2024-08-21 NOTE — Group Note (Deleted)
 Date:  08/21/2024 Time:  8:44 PM  Group Topic/Focus:  Self Esteem Action Plan:   The focus of this group is to help patients create a plan to continue to build self-esteem after discharge.     Participation Level:  {BHH PARTICIPATION OZCZO:77735}  Participation Quality:  {BHH PARTICIPATION QUALITY:22265}  Affect:  {BHH AFFECT:22266}  Cognitive:  {BHH COGNITIVE:22267}  Insight: {BHH Insight2:20797}  Engagement in Group:  {BHH ENGAGEMENT IN HMNLE:77731}  Modes of Intervention:  {BHH MODES OF INTERVENTION:22269}  Additional Comments:  ***  Edward [name] 08/21/2024, 8:44 PM

## 2024-08-21 NOTE — Group Note (Signed)
 Date:  08/21/2024 Time:  12:01 PM  Group Topic/Focus: Physical wellness  This physical wellness group focused on Zumba and music-based movement using YouTube videos. Patients were encouraged to engage in dancing with peers as a fun, accessible way to increase physical activity and promote circulation as an alternative to gym-based exercise.    Participation Level:  Did Not Attend  Participation Quality:  N/A  Affect:  N/A  Cognitive:  N/A  Insight: None  Engagement in Group:  None  Modes of Intervention:  N/A  Additional Comments:  Pt did not attend this group  Kristi HERO [facility] 08/21/2024, 12:01 PM

## 2024-08-21 NOTE — BHH Group Notes (Signed)
 Adult Psychoeducational Group Note  Date:  08/21/2024 Time:  8:39 PM  Group Topic/Focus:  Wrap-Up Group:   The focus of this group is to help patients review their daily goal of treatment and discuss progress on daily workbooks.  Participation Level:  Active  Participation Quality:  Appropriate  Affect:  Appropriate  Cognitive:  Appropriate  Insight: Appropriate  Engagement in Group:  Engaged  Modes of Intervention:  Discussion  Additional Comments:  Pt attended group.  Drue Pouch 08/21/2024, 8:39 PM

## 2024-08-21 NOTE — Group Note (Signed)
 Date:  08/21/2024 Time:  10:25 AM  Group Topic/Focus: Goals group  Patients began by introducing themselves and sharing their favorite food as an research scientist (life sciences) activity. After that, they were given SMART goal worksheets to complete. Examples of SMART goals were provided, and the patients had time to fill out their worksheets. Once finished, each patient shared their icebreaker responses and SMART goals with the group, encouraging social interaction and goal-setting.   Participation Level:  Did Not Attend  Participation Quality:  N/A  Affect:  N/A  Cognitive:  N/A  Insight: None  Engagement in Group:  None  Modes of Intervention:  N/A  Additional Comments:  Pt did not attend goals group  Kristi HERO [facility] 08/21/2024, 10:25 AM

## 2024-08-21 NOTE — Progress Notes (Signed)
(  Sleep Hours) - 8.75 (Any PRNs that were needed, meds refused, or side effects to meds)- hydroxyzine  (twice) (Any disturbances and when (visitation, over night)- none (Concerns raised by the patient)- none (SI/HI/AVH)- denies

## 2024-08-21 NOTE — Group Note (Signed)
 Date:  08/21/2024 Time:  10:55 AM  Group Topic/Focus: Social wellness  This group focused on the concept of control--gaining it, losing it, and sharing it. The patients sat in a circle with a blank piece of paper and were instructed to draw something on it. A timer was set for one minute, and when it went off, patients were to pass their papers to the left. They were encouraged to add to the drawing, whether it related to the original theme or not. At the end of the activity, patients shared their drawings, reflected on what was added, and discussed their experiences together. The group then explored how it felt to share control and consider different perspectives. This activity promoted creative thinking and fostered positive social interactions.    Participation Level:  Did Not Attend  Participation Quality:  N/A  Affect:  N/A  Cognitive:  N/A  Insight: None  Engagement in Group:  None  Modes of Intervention:  N/A  Additional Comments:  Pt did not attend this group  Kristi HERO [facility] 08/21/2024, 10:55 AM

## 2024-08-21 NOTE — Progress Notes (Signed)
" °   08/21/24 1000  Psych Admission Type (Psych Patients Only)  Admission Status Involuntary  Psychosocial Assessment  Patient Complaints Irritability  Eye Contact Fair  Facial Expression Angry  Affect Irritable  Speech Logical/coherent  Interaction Minimal  Motor Activity Other (Comment) (wnl)  Appearance/Hygiene Disheveled  Behavior Characteristics Cooperative;Agitated;Irritable  Mood Irritable  Thought Process  Coherency WDL  Content WDL  Delusions None reported or observed  Perception WDL  Hallucination None reported or observed  Judgment Poor  Confusion None  Danger to Self  Current suicidal ideation? Denies  Danger to Others  Danger to Others None reported or observed    "

## 2024-08-21 NOTE — Group Note (Signed)
 Date:  08/21/2024 Time:  5:25 PM    Group Topic/Focus:  Dimensions of Wellness:   The focus of this group is to introduce the topic of wellness using collage. Patients created intuitive collages: serving as a creative outlet for expressing emotions, reducing anxiety, fostering group cohesion and enabling personal insight and healing.   Participation Level:  Did Not Attend   Berwyn GORMAN Acosta 08/21/2024, 5:25 PM

## 2024-08-21 NOTE — Plan of Care (Signed)

## 2024-08-21 NOTE — Group Note (Signed)
 Date:  08/21/2024 Time:  4:34 PM  Group Topic/Focus: Karaoke and bingo  Patients participated in a 20-minute karaoke session, which provided an opportunity for them to express their creativity, build confidence, and offer support to each other. Following the karaoke, the group spent the remainder of the hour playing bingo with this MHT. The activity fostered engagement and social interaction among the patients. Winners of [employer] game were rewarded with an extra snack as a positive reinforcement.    Participation Level:  Did Not Attend  Participation Quality:  N/A  Affect:  N/A  Cognitive:  N/A  Insight: None  Engagement in Group:  None  Modes of Intervention:  N/A  Additional Comments:  Pt did not attend this group  Kristi HERO [facility] 08/21/2024, 4:34 PM

## 2024-08-22 MED ORDER — ARIPIPRAZOLE 15 MG PO TABS: 15.0000 mg | ORAL_TABLET | Freq: Every day | ORAL | 0 refills | Status: AC

## 2024-08-22 MED ORDER — ARIPIPRAZOLE ER 400 MG IM SRER: 400.0000 mg | INTRAMUSCULAR | 0 refills | Status: AC

## 2024-08-22 MED ORDER — HYDROXYZINE HCL 25 MG PO TABS: 25.0000 mg | ORAL_TABLET | Freq: Three times a day (TID) | ORAL | 0 refills | Status: AC | PRN

## 2024-08-22 NOTE — Progress Notes (Signed)
" °  [facility] Adult Case Management Discharge Plan :  Will you be returning to the same living situation after discharge:  Yes,  pt will return home  At discharge, do you have transportation home?: Yes,  CSW will assist with transportation (taxi)  Do you have the ability to pay for your medications: Yes,  [employer] / [employer]  Release of information consent forms completed and in the chart;  Patient's signature needed at discharge.  Patient to Follow up at:  Follow-up Information     [facility] Follow up on 08/27/2024.   Why: You have a hospital follow up appointment for therapy and medication management services on 08/27/24 at 1:30 pm .  The appointment will be Virtual, telehealth. Contact information: [address]  [address] [place] [address] [phone]         [facility] Follow up.   Why: Please go to this provider for an in-person assessment and ask to be considered for their ACT team (medication management and therapy that meet you where you are in the community). They are open 24hours a day, 7 days a week. The first appointment must be in person. You do not need a hospital referral for this service through [facility].   If you do not have transportation, please call your insurance, [employer] tailored plan ([phone]) and get transportation set up for any date/time that is convenient for you. They will take you to and from your appointment. Contact information: [address] [place] [address] [phone]                 Next level of care provider has access to [facility] Link:no  Safety Planning and Suicide Prevention discussed: Yes,  [name], mom, [phone]     Has patient been referred to the Quitline?: Patient does not use tobacco/nicotine products  Patient has been referred for addiction treatment: No known substance use disorder.  [address]  LCSWA 08/22/2024, 9:19 AM "

## 2024-08-22 NOTE — Progress Notes (Signed)
(  Sleep Hours) -8.75 as of 0530 (Any PRNs that were needed, meds refused, or side effects to meds)- prn hydroxyzine  @ 2131 (Any disturbances and when (visitation, over night)-none (Concerns raised by the patient)- none (SI/HI/AVH)- denies all

## 2024-08-22 NOTE — Discharge Summary (Signed)
 " Physician Discharge Summary Note  Patient:  Edward [name] is an 23 y.o., male MRN:  [id number] DOB:  04-12-2001 Patient phone:  [phone] (home)  Patient address:   [address] [place] [address],  Total Time spent with patient: 20 minutes  Date of Admission:  08/16/2024 Date of Discharge: 08/22/2024  Reason for Admission:   The patient  states that he was admitted after texting his therapist that he was endorsing suicidal ideation with a plan to overdose on his prescription medications. He reports feeling angry with his therapist for disclosing this information and states that he did not act on these thoughts, emphasizing that they were only thoughts. The patient reports that he told the hospital physician that he does not benefit from inpatient psychiatric facilities and believes he already has therapy and medication management, stating, I dont really need to be here. He reports that his primary desire is to return home, stating he wants to be with his three cats and plans to return to his mothers home upon discharge.   Principal Problem: Bipolar 1 disorder [facility]) Discharge Diagnoses: Principal Problem:   Bipolar 1 disorder [facility])   Past Psychiatric History:  Previous psychiatric diagnoses: Bipolar disorder, MDD, ADHD, ODD Prior inpatient treatment: Two prior psychiatric hospitalizations at this behavioral health hospital (December 2024 and September 2025) Prior rehab treatment: Denies History of suicide: One prior suicide attempt via intentional overdose on his psychotropic medications in September 2025 History of homicide: Denies Psychiatric medication history: Abilify  oral and Abilify  Maintena Psychiatric medication compliance history: Noncompliant due to running out of medication and lack of transportation Neuromodulation history: Denies Current psychiatrist: None established Current therapist: Weekly therapy sessions; reports inability to recall agency  name; chart review indicates [facility]  Past Medical History:  Past Medical History:  Diagnosis Date   ADHD    Bipolar 1 disorder (HCC)    Intellectual developmental disorder, mild    ODD (oppositional defiant disorder)    Vitamin D  insufficiency 08/30/2023   History reviewed. No pertinent surgical history. Family History: History reviewed. No pertinent family history. Family Psychiatric  History:  Psychiatric: Older brother with schizophrenia per chart review Psychiatric medications: Unknown Suicide attempts/completed suicide: Denies Substance Use: Denies   Social History:  Social History   Substance and Sexual Activity  Alcohol Use No     Social History   Substance and Sexual Activity  Drug Use No    Social History   Socioeconomic History   Marital status: Single    Spouse name: Not on file   Number of children: Not on file   Years of education: Not on file   Highest education level: Not on file  Occupational History   Not on file  Tobacco Use   Smoking status: Never   Smokeless tobacco: Never  Substance and Sexual Activity   Alcohol use: No   Drug use: No   Sexual activity: Not on file  Other Topics Concern   Not on file  Social History Narrative   Not on file   Social Drivers of Health   Tobacco Use: Low Risk (08/17/2024)   Patient History    Smoking Tobacco Use: Never    Smokeless Tobacco Use: Never    Passive Exposure: Not on file  Financial Resource Strain: Not on file  Food Insecurity: No Food Insecurity (08/16/2024)   Epic    Worried About Radiation Protection Practitioner of Food in the Last Year: Never true    Ran  Out of Food in the Last Year: Never true  Recent Concern: Food Insecurity - Food Insecurity Present (05/24/2024)   Epic    Worried About Programme Researcher, Broadcasting/film/video in the Last Year: Sometimes true    Ran Out of Food in the Last Year: Sometimes true  Transportation Needs: Unmet Transportation Needs (08/16/2024)   Epic    Lack of Transportation  (Medical): Yes    Lack of Transportation (Non-Medical): No  Physical Activity: Not on file  Stress: Not on file  Social Connections: Unknown (01/15/2022)   [facility] from [facility]   Social Network    Social Network: Not on file  Depression (PHQ2-9): High Risk (06/16/2024)   Depression (PHQ2-9)    PHQ-2 Score: 16  Alcohol Screen: Low Risk (08/16/2024)   Alcohol Screen    Last Alcohol Screening Score (AUDIT): 0  Housing: Low Risk (08/16/2024)   Epic    Unable to Pay for Housing in the Last Year: No    Number of Times Moved in the Last Year: 0    Homeless in the Last Year: No  Recent Concern: Housing - High Risk (05/24/2024)   Epic    Unable to Pay for Housing in the Last Year: Yes    Number of Times Moved in the Last Year: 2    Homeless in the Last Year: Patient declined  Utilities: Not At Risk (08/16/2024)   Epic    Threatened with loss of utilities: No  Health Literacy: Not on file    Hospital Course:   During the patient's hospitalization, patient had extensive initial psychiatric evaluation, and follow-up psychiatric evaluations every day.  Psychiatric diagnoses provided upon initial assessment: Bipolar Disorder   Patient's psychiatric medications were adjusted on admission: Continued oral Abilify .   During the hospitalization, other adjustments were made to the patient's psychiatric medication regimen: He received Abilify  Maintena on 12/19.   Patient's care was discussed during the interdisciplinary team meeting every day during the hospitalization.  The patient is not having side effects to prescribed psychiatric medication.  Gradually, patient started adjusting to milieu. The patient was evaluated each day by a clinical provider to ascertain response to treatment. Improvement was noted by the patient's report of decreasing symptoms, improved sleep and appetite, affect, medication tolerance, behavior, and participation in unit programming.  Patient was asked each day  to complete a self inventory noting mood, mental status, pain, new symptoms, anxiety and concerns.   Symptoms were reported as significantly decreased or resolved completely by discharge.  The patient reports that their mood is stable.  The patient denied having suicidal thoughts for more than 48 hours prior to discharge.  Patient denies having homicidal thoughts.  Patient denies having auditory hallucinations.  Patient denies any visual hallucinations or other symptoms of psychosis.  The patient was motivated to continue taking medication with a goal of continued improvement in mental health.   The patient reports their target psychiatric symptoms of depression and mood instability responded well to the psychiatric medications, and the patient reports overall benefit other psychiatric hospitalization. Supportive psychotherapy was provided to the patient. The patient also participated in regular group therapy while hospitalized. Coping skills, problem solving as well as relaxation therapies were also part of the unit programming.  Labs were reviewed with the patient, and abnormal results were discussed with the patient.  The patient is able to verbalize their individual safety plan to this provider.  # It is recommended to the patient to continue psychiatric medications as  prescribed, after discharge from the hospital.    # It is recommended to the patient to follow up with your outpatient psychiatric provider and PCP.  # It was discussed with the patient, the impact of alcohol, drugs, tobacco have been there overall psychiatric and medical wellbeing, and total abstinence from substance use was recommended the patient.ed.  # Prescriptions provided or sent directly to preferred pharmacy at discharge. Patient agreeable to plan. Given opportunity to ask questions. Appears to feel comfortable with discharge.    # In the event of worsening symptoms, the patient is instructed to call the crisis  hotline, 911 and or go to the nearest ED for appropriate evaluation and treatment of symptoms. To follow-up with primary care provider for other medical issues, concerns and or health care needs  # Patient was discharged home with a plan to follow up as noted below.    On day of discharge he reports feeling well and that his medication is doing good for him.  He reports the soreness in his arm from the injection is continuing to improve.  Discussed with him the importance of continuing to take the oral Abilify  for 2 more weeks but then after that he would only need the injection once a month.  Stressed to him the importance of continuing to stay on his medications given the severity of his mood instability when he is not on his medications and he reports understanding.  He reports his sleep is good.  He reports his appetite is doing good.  He reports no SI, HI, or AVH.  Discussed with him the importance of taking his medications as prescribed and attending his follow up appointments and he reported understanding.  Discussed with him what to do in the event of a future crisis.  Discussed that he can go to [facility], go to the nearest ED, or call 911 or 988.   He reported understanding and had no concerns.  He was discharged home.   Physical Findings: AIMS: Facial and Oral Movements Muscles of Facial Expression: None Lips and Perioral Area: None Jaw: None Tongue: None,Extremity Movements Upper (arms, wrists, hands, fingers): None Lower (legs, knees, ankles, toes): None, Trunk Movements Neck, shoulders, hips: None, Global Judgements Severity of abnormal movements overall : None Incapacitation due to abnormal movements: None Patient's awareness of abnormal movements: No Awareness,  ,  , AIMS Total Score AIMS Total Score: 0 CIWA:    COWS:     Musculoskeletal: Strength & Muscle Tone: within normal limits Gait & Station: normal Patient leans: N/A   Psychiatric Specialty Exam:  Presentation   General Appearance:  Appropriate for Environment; Casual  Eye Contact: Fair  Speech: Clear and Coherent; Normal Rate  Speech Volume: Normal  Handedness: Right   Mood and Affect  Mood: -- (ok)  Affect: Congruent   Thought Process  Thought Processes: Goal Directed; Linear; Coherent  Descriptions of Associations:Intact  Orientation:Full (Time, Place and Person)  Thought Content:Logical; WDL  History of Schizophrenia/Schizoaffective disorder:No data recorded Duration of Psychotic Symptoms:No data recorded Hallucinations:Hallucinations: None  Ideas of Reference:None  Suicidal Thoughts:Suicidal Thoughts: No  Homicidal Thoughts:Homicidal Thoughts: No   Sensorium  Memory: Immediate Fair; Recent Fair  Judgment: Fair  Insight: Fair   Art Therapist  Concentration: Good  Attention Span: Good  Recall: Good  Fund of Knowledge: Good  Language: Good   Psychomotor Activity  Psychomotor Activity: Psychomotor Activity: Normal   Assets  Assets: Social Support; Resilience   Sleep  Sleep: Sleep: Fair  Estimated Sleeping Duration (Last 24 Hours): 6.50-8.00 hours   Physical Exam: Physical Exam Vitals and nursing note reviewed.  Constitutional:      General: He is not in acute distress.    Appearance: Normal appearance. He is normal weight. He is not ill-appearing or toxic-appearing.  HENT:     Head: Normocephalic and atraumatic.  Pulmonary:     Effort: Pulmonary effort is normal.  Musculoskeletal:        General: Normal range of motion.  Neurological:     General: No focal deficit present.     Mental Status: He is alert.    Review of Systems  Respiratory:  Negative for cough and shortness of breath.   Cardiovascular:  Negative for chest pain.  Gastrointestinal:  Negative for abdominal pain, constipation, diarrhea, nausea and vomiting.  Neurological:  Negative for dizziness, weakness and headaches.   Psychiatric/Behavioral:  Negative for depression, hallucinations and suicidal ideas. The patient is not nervous/anxious.    Blood pressure 128/69, pulse 67, temperature (!) 97.1 F (36.2 C), temperature source Oral, resp. rate 17, height 5' 11 (1.803 m), weight 86.8 kg, SpO2 100%. Body mass index is 26.69 kg/m.   Tobacco Use History[1] Tobacco Cessation:  N/A, patient does not currently use tobacco products   Blood Alcohol level:  Lab Results  Component Value Date   [facility] <15 08/14/2024   ETH <15 05/22/2024    Metabolic Disorder Labs:  Lab Results  Component Value Date   HGBA1C 5.2 05/25/2024   MPG 102.54 05/25/2024   MPG 111 08/29/2023   No results found for: PROLACTIN Lab Results  Component Value Date   CHOL 174 05/25/2024   TRIG 85 05/25/2024   HDL 40 (L) 05/25/2024   CHOLHDL 4.4 05/25/2024   VLDL 17 05/25/2024   LDLCALC 117 (H) 05/25/2024   LDLCALC 70 08/29/2023    See Psychiatric Specialty Exam and Suicide Risk Assessment completed by Attending Physician prior to discharge.  Discharge destination:  Home  Is patient on multiple antipsychotic therapies at discharge:  No   Has Patient had three or more failed trials of antipsychotic monotherapy by history:  No  Recommended Plan for Multiple Antipsychotic Therapies: NA  Discharge Instructions     Increase activity slowly   Complete by: As directed       Allergies as of 08/22/2024   No Known Allergies      Medication List     TAKE these medications      Indication  ARIPiprazole  15 MG tablet Commonly known as: ABILIFY  Take 1 tablet (15 mg total) by mouth daily.  Indication: MIXED BIPOLAR AFFECTIVE DISORDER   ARIPiprazole  ER 400 MG Srer injection Commonly known as: ABILIFY  MAINTENA Inject 2 mLs (400 mg total) into the muscle every 28 (twenty-eight) days. Start taking on: September 17, 2024  Indication: Bipolar Affective Disorder   hydrOXYzine  25 MG tablet Commonly known as: ATARAX  Take 1  tablet (25 mg total) by mouth 3 (three) times daily as needed for anxiety.  Indication: Feeling Anxious        Follow-up Information     [facility] Follow up on 08/27/2024.   Why: You have a hospital follow up appointment for therapy and medication management services on 08/27/24 at 1:30 pm .  The appointment will be Virtual, telehealth. Contact information: [address]  [address] [place] [address] [phone]         [facility] Follow up.   Why: Please go to this provider  for an in-person assessment and ask to be considered for their ACT team (medication management and therapy that meet you where you are in the community). They are open 24hours a day, 7 days a week. The first appointment must be in person. You do not need a hospital referral for this service through [facility].   If you do not have transportation, please call your insurance, [employer] tailored plan ([phone]) and get transportation set up for any date/time that is convenient for you. They will take you to and from your appointment. Contact information: [address] [place] [address] [phone]                 Follow-up recommendations/Comments:   Activity: as tolerated   Diet: heart healthy   Other: -Follow-up with your outpatient psychiatric provider -instructions on appointment date, time, and address (location) are provided to you in discharge paperwork.   -Take your psychiatric medications as prescribed at discharge - instructions are provided to you in the discharge paperwork   -Follow-up with outpatient primary care doctor and other specialists -for management of chronic medical disease, including: Routine Care.  Low Vitamin D .  Continue to take your Abilify  tablet daily for 14 days.  Your next injection is due 1/16.   -Testing: Follow-up with outpatient provider for abnormal lab results: Low Vitamin D .   -Recommend abstinence  from alcohol, tobacco, and other illicit drug use at discharge.    -If your psychiatric symptoms recur, worsen, or if you have side effects to your psychiatric medications, call your outpatient psychiatric provider, 911, 988 or go to the nearest emergency department.   -If suicidal thoughts recur, call your outpatient psychiatric provider, 911, 988 or go to the nearest emergency department.  Signed: Marsa GORMAN Rosser, DO 08/22/2024, 9:12 AM           [1]  Social History Tobacco Use  Smoking Status Never  Smokeless Tobacco Never   "

## 2024-08-22 NOTE — Group Note (Signed)
 Date:  08/22/2024 Time:  10:52 AM  Group Topic/Focus:  Goals Group:   The focus of this group is to help patients establish daily goals to achieve during treatment and discuss how the patient can incorporate goal setting into their daily lives to aide in recovery.    Participation Level:  Did Not Attend   Edward [name] 08/22/2024, 10:52 AM

## 2024-08-22 NOTE — [facility] Suicide Risk Assessment (Signed)
 [facility] Discharge Suicide Risk Assessment   Principal Problem: Bipolar 1 disorder [facility]) Discharge Diagnoses: Principal Problem:   Bipolar 1 disorder (HCC)  During the patient's hospitalization, patient had extensive initial psychiatric evaluation, and follow-up psychiatric evaluations every day.  Psychiatric diagnoses provided upon initial assessment: Bipolar Disorder  Patient's psychiatric medications were adjusted on admission: Continued oral Abilify .  During the hospitalization, other adjustments were made to the patient's psychiatric medication regimen: He received Abilify  Maintena on 12/19.  Gradually, patient started adjusting to milieu.   Patient's care was discussed during the interdisciplinary team meeting every day during the hospitalization.  The patient is not having side effects to prescribed psychiatric medication.  The patient reports their target psychiatric symptoms of depression and mood instability responded well to the psychiatric medications, and the patient reports overall benefit other psychiatric hospitalization. Supportive psychotherapy was provided to the patient. The patient also participated in regular group therapy while admitted.   Labs were reviewed with the patient, and abnormal results were discussed with the patient.  The patient denied having suicidal thoughts more than 48 hours prior to discharge.  Patient denies having homicidal thoughts.  Patient denies having auditory hallucinations.  Patient denies any visual hallucinations.  Patient denies having paranoid thoughts.  The patient is able to verbalize their individual safety plan to this provider.  It is recommended to the patient to continue psychiatric medications as prescribed, after discharge from the hospital.    It is recommended to the patient to follow up with your outpatient psychiatric provider and PCP.  Discussed with the patient, the impact of alcohol, drugs, tobacco have been there  overall psychiatric and medical wellbeing, and total abstinence from substance use was recommended the patient.  Total Time spent with patient: 20 minutes  Musculoskeletal: Strength & Muscle Tone: within normal limits Gait & Station: normal Patient leans: N/A  Psychiatric Specialty Exam  Presentation  General Appearance:  Appropriate for Environment; Casual  Eye Contact: Fair  Speech: Clear and Coherent; Normal Rate  Speech Volume: Normal  Handedness: Right   Mood and Affect  Mood: -- (ok)  Duration of Depression Symptoms: No data recorded Affect: Congruent   Thought Process  Thought Processes: Goal Directed; Linear; Coherent  Descriptions of Associations:Intact  Orientation:Full (Time, Place and Person)  Thought Content:Logical; WDL  History of Schizophrenia/Schizoaffective disorder:No data recorded Duration of Psychotic Symptoms:No data recorded Hallucinations:Hallucinations: None  Ideas of Reference:None  Suicidal Thoughts:Suicidal Thoughts: No  Homicidal Thoughts:Homicidal Thoughts: No   Sensorium  Memory: Immediate Fair; Recent Fair  Judgment: Fair  Insight: Fair   Art Therapist  Concentration: Good  Attention Span: Good  Recall: Good  Fund of Knowledge: Good  Language: Good   Psychomotor Activity  Psychomotor Activity: Psychomotor Activity: Normal   Assets  Assets: Social Support; Resilience   Sleep  Sleep: Sleep: Fair  Estimated Sleeping Duration (Last 24 Hours): 6.50-8.00 hours  Physical Exam: Physical Exam Vitals and nursing note reviewed.  Constitutional:      General: He is not in acute distress.    Appearance: Normal appearance. He is normal weight. He is not ill-appearing or toxic-appearing.  HENT:     Head: Normocephalic and atraumatic.  Pulmonary:     Effort: Pulmonary effort is normal.  Musculoskeletal:        General: Normal range of motion.  Neurological:     General: No focal  deficit present.     Mental Status: He is alert.    Review of Systems  Respiratory:  Negative for cough and shortness of breath.   Cardiovascular:  Negative for chest pain.  Gastrointestinal:  Negative for abdominal pain, constipation, diarrhea, nausea and vomiting.  Neurological:  Negative for dizziness, weakness and headaches.  Psychiatric/Behavioral:  Negative for depression, hallucinations and suicidal ideas. The patient is not nervous/anxious.    Blood pressure 128/69, pulse 67, temperature (!) 97.1 F (36.2 C), temperature source Oral, resp. rate 17, height 5' 11 (1.803 m), weight 86.8 kg, SpO2 100%. Body mass index is 26.69 kg/m.  Mental Status Per Nursing Assessment::   On Admission:  Suicidal ideation indicated by patient, Suicide plan, Self-harm thoughts  Demographic Factors:  Male, Adolescent or young adult, and Unemployed  Loss Factors: Decrease in vocational status and Loss of significant relationship  Historical Factors: Prior suicide attempts, Family history of mental illness or substance abuse, and Impulsivity  Risk Reduction Factors:   Living with another person, especially a relative and Positive social support  Continued Clinical Symptoms:  Previous Psychiatric Diagnoses and Treatments  Cognitive Features That Contribute To Risk:  Closed-mindedness and Loss of executive function    Suicide Risk:  Minimal: No identifiable suicidal ideation.  Patients presenting with no risk factors but with morbid ruminations; may be classified as minimal risk based on the severity of the depressive symptoms.  However, as he does have a history of prior suicide attempts, there is some chronic risk present.   Follow-up Information     [facility] Follow up on 08/27/2024.   Why: You have a hospital follow up appointment for therapy and medication management services on 08/27/24 at 1:30 pm .  The appointment will be Virtual, telehealth. Contact information: [address]   [address] [place] [address] [phone]         [facility] Follow up.   Why: Please go to this provider for an in-person assessment and ask to be considered for their ACT team (medication management and therapy that meet you where you are in the community). They are open 24hours a day, 7 days a week. The first appointment must be in person. You do not need a hospital referral for this service through [facility].   If you do not have transportation, please call your insurance, [employer] tailored plan ([phone]) and get transportation set up for any date/time that is convenient for you. They will take you to and from your appointment. Contact information: [address] [place] [address] [phone]                 Plan Of Care/Follow-up recommendations:  Activity: as tolerated  Diet: heart healthy  Other: -Follow-up with your outpatient psychiatric provider -instructions on appointment date, time, and address (location) are provided to you in discharge paperwork.  -Take your psychiatric medications as prescribed at discharge - instructions are provided to you in the discharge paperwork  -Follow-up with outpatient primary care doctor and other specialists -for management of chronic medical disease, including: Routine Care.  Low Vitamin D .  Continue to take your Abilify  tablet daily for 14 days.  Your next injection is due 1/16.  -Testing: Follow-up with outpatient provider for abnormal lab results: Low Vitamin D .  -Recommend abstinence from alcohol, tobacco, and other illicit drug use at discharge.   -If your psychiatric symptoms recur, worsen, or if you have side effects to your psychiatric medications, call your outpatient psychiatric provider, 911, [phone] or go to the nearest emergency department.  -If suicidal thoughts recur, call your outpatient  psychiatric provider, 911, [phone] or go to the nearest emergency  department.   Marsa GORMAN Rosser, DO 08/22/2024, 8:24 AM

## 2024-08-22 NOTE — [facility] Counselor (Signed)
 CSW contacted pt's mother regarding address for pt drop-off.   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   Lum Croft, MSW, [facility] 08/22/2024 9:38 AM
